# Patient Record
Sex: Male | Born: 1940 | Race: White | Hispanic: No | Marital: Married | Smoking: Never smoker
Health system: Southern US, Community
[De-identification: ages and names within clinical notes are randomized; demographics above are authoritative.]

## PROBLEM LIST (undated history)

## (undated) DIAGNOSIS — K5790 Diverticulosis of intestine, part unspecified, without perforation or abscess without bleeding: Secondary | ICD-10-CM

## (undated) DIAGNOSIS — C61 Malignant neoplasm of prostate: Secondary | ICD-10-CM

## (undated) DIAGNOSIS — D649 Anemia, unspecified: Secondary | ICD-10-CM

## (undated) DIAGNOSIS — E785 Hyperlipidemia, unspecified: Secondary | ICD-10-CM

## (undated) DIAGNOSIS — I10 Essential (primary) hypertension: Secondary | ICD-10-CM

## (undated) DIAGNOSIS — N138 Other obstructive and reflux uropathy: Secondary | ICD-10-CM

## (undated) DIAGNOSIS — N401 Enlarged prostate with lower urinary tract symptoms: Secondary | ICD-10-CM

## (undated) DIAGNOSIS — C801 Malignant (primary) neoplasm, unspecified: Secondary | ICD-10-CM

## (undated) HISTORY — DX: Malignant (primary) neoplasm, unspecified: C80.1

## (undated) HISTORY — DX: Anemia, unspecified: D64.9

## (undated) HISTORY — DX: Essential (primary) hypertension: I10

## (undated) HISTORY — DX: Hyperlipidemia, unspecified: E78.5

## (undated) HISTORY — PX: TONSILLECTOMY: SUR1361

---

## 2005-07-30 ENCOUNTER — Encounter: Admission: RE | Admit: 2005-07-30 | Discharge: 2005-07-30 | Payer: Self-pay | Admitting: Internal Medicine

## 2005-12-01 ENCOUNTER — Ambulatory Visit: Payer: Self-pay | Admitting: Family Medicine

## 2005-12-09 ENCOUNTER — Ambulatory Visit: Payer: Self-pay | Admitting: Family Medicine

## 2006-06-23 ENCOUNTER — Ambulatory Visit: Payer: Self-pay | Admitting: Family Medicine

## 2006-07-07 ENCOUNTER — Ambulatory Visit: Payer: Self-pay | Admitting: Family Medicine

## 2006-07-21 ENCOUNTER — Ambulatory Visit: Payer: Self-pay | Admitting: Family Medicine

## 2006-09-07 ENCOUNTER — Ambulatory Visit: Payer: Self-pay | Admitting: Family Medicine

## 2006-09-09 ENCOUNTER — Ambulatory Visit: Payer: Self-pay | Admitting: Gastroenterology

## 2006-09-23 ENCOUNTER — Ambulatory Visit: Payer: Self-pay | Admitting: Family Medicine

## 2006-10-05 ENCOUNTER — Ambulatory Visit: Payer: Self-pay | Admitting: Family Medicine

## 2007-01-21 ENCOUNTER — Ambulatory Visit: Payer: Self-pay | Admitting: Family Medicine

## 2007-09-23 ENCOUNTER — Ambulatory Visit: Payer: Self-pay | Admitting: Family Medicine

## 2008-08-10 ENCOUNTER — Ambulatory Visit: Payer: Self-pay | Admitting: Family Medicine

## 2008-10-09 ENCOUNTER — Ambulatory Visit: Payer: Self-pay | Admitting: Family Medicine

## 2008-11-27 ENCOUNTER — Ambulatory Visit: Payer: Self-pay | Admitting: Family Medicine

## 2009-06-18 ENCOUNTER — Ambulatory Visit: Payer: Self-pay | Admitting: Family Medicine

## 2009-07-17 ENCOUNTER — Ambulatory Visit: Payer: Self-pay | Admitting: Family Medicine

## 2009-10-16 ENCOUNTER — Ambulatory Visit: Payer: Self-pay | Admitting: Family Medicine

## 2010-02-18 ENCOUNTER — Ambulatory Visit: Payer: Self-pay | Admitting: Family Medicine

## 2010-06-24 ENCOUNTER — Ambulatory Visit: Payer: Self-pay | Admitting: Family Medicine

## 2010-07-17 ENCOUNTER — Ambulatory Visit (INDEPENDENT_AMBULATORY_CARE_PROVIDER_SITE_OTHER): Payer: PRIVATE HEALTH INSURANCE | Admitting: Family Medicine

## 2010-07-17 DIAGNOSIS — E7800 Pure hypercholesterolemia, unspecified: Secondary | ICD-10-CM

## 2010-07-17 DIAGNOSIS — I10 Essential (primary) hypertension: Secondary | ICD-10-CM

## 2010-07-17 DIAGNOSIS — E119 Type 2 diabetes mellitus without complications: Secondary | ICD-10-CM

## 2010-07-17 DIAGNOSIS — Z79899 Other long term (current) drug therapy: Secondary | ICD-10-CM

## 2010-10-15 ENCOUNTER — Other Ambulatory Visit: Payer: Self-pay | Admitting: Family Medicine

## 2010-11-03 ENCOUNTER — Encounter: Payer: Self-pay | Admitting: Family Medicine

## 2010-11-25 ENCOUNTER — Ambulatory Visit (INDEPENDENT_AMBULATORY_CARE_PROVIDER_SITE_OTHER): Payer: PRIVATE HEALTH INSURANCE | Admitting: Family Medicine

## 2010-11-25 ENCOUNTER — Encounter: Payer: Self-pay | Admitting: Family Medicine

## 2010-11-25 DIAGNOSIS — N529 Male erectile dysfunction, unspecified: Secondary | ICD-10-CM

## 2010-11-25 DIAGNOSIS — E1159 Type 2 diabetes mellitus with other circulatory complications: Secondary | ICD-10-CM

## 2010-11-25 DIAGNOSIS — I10 Essential (primary) hypertension: Secondary | ICD-10-CM

## 2010-11-25 DIAGNOSIS — E785 Hyperlipidemia, unspecified: Secondary | ICD-10-CM

## 2010-11-25 DIAGNOSIS — E669 Obesity, unspecified: Secondary | ICD-10-CM

## 2010-11-25 DIAGNOSIS — I152 Hypertension secondary to endocrine disorders: Secondary | ICD-10-CM | POA: Insufficient documentation

## 2010-11-25 DIAGNOSIS — E119 Type 2 diabetes mellitus without complications: Secondary | ICD-10-CM | POA: Insufficient documentation

## 2010-11-25 DIAGNOSIS — E1169 Type 2 diabetes mellitus with other specified complication: Secondary | ICD-10-CM

## 2010-11-25 LAB — POCT GLYCOSYLATED HEMOGLOBIN (HGB A1C): Hemoglobin A1C: 6.8

## 2010-11-25 NOTE — Progress Notes (Signed)
  Subjective:    Patient ID: Adrian Bass, male    DOB: 07-01-1940, 70 y.o.   MRN: [id number]  HPI He is here for a diabetes recheck. His weight and blood pressure are recorded. He is on medications listed in the chart. States his blood sugars run in the 140-150 range. Last eye exam was May . He exercises regularly. Does not smoke and drinks rarely he is still working well for him. He does check his feet regularly. In general he is doing quite well and is very happy. His immunizations are up-to-date. He continues to do quite well on his Levitra.   Review of Systems Negative except as above    Objective:   Physical Exam Alert and in no distress otherwise not examined. Hemoglobin A1c is 6.8       Assessment & Plan:   diabetes. Hypertension. Dyslipidemia. Obesity. Ed I will do a followup urine microalbumin and recheck here in 4 months.

## 2010-11-25 NOTE — Patient Instructions (Signed)
Keep  taking great care yourself. See back here in about 4 months

## 2010-12-25 ENCOUNTER — Other Ambulatory Visit: Payer: Self-pay

## 2010-12-25 NOTE — Telephone Encounter (Signed)
Dr.l wants me to call pt and find out how many pills he wants

## 2010-12-31 ENCOUNTER — Telehealth: Payer: Self-pay | Admitting: Family Medicine

## 2010-12-31 NOTE — Telephone Encounter (Signed)
DONE

## 2011-01-01 ENCOUNTER — Ambulatory Visit (INDEPENDENT_AMBULATORY_CARE_PROVIDER_SITE_OTHER): Payer: PRIVATE HEALTH INSURANCE | Admitting: Family Medicine

## 2011-01-01 DIAGNOSIS — N529 Male erectile dysfunction, unspecified: Secondary | ICD-10-CM

## 2011-01-01 DIAGNOSIS — H1045 Other chronic allergic conjunctivitis: Secondary | ICD-10-CM

## 2011-01-01 DIAGNOSIS — H1013 Acute atopic conjunctivitis, bilateral: Secondary | ICD-10-CM

## 2011-01-01 NOTE — Progress Notes (Signed)
  Subjective:    Patient ID: Adrian Bass, male    DOB: [birth year], 70 y.o.   MRN: [id number]  HPI He is here for consultation concerning possible medication change. He did have me write a prescription for Levitra. He plans to get this through a pharmacy in [place] for much better price. Also a letter was received from his insurance plan asking him to switch to a different eyedrop. I discussed this with him in detail explaining that this is a way for him to potentially save money but get a similar medication. At this time he is unwilling to change since the medicine seems to be working well.   Review of Systems     Objective:   Physical Exam Alert and in no distress otherwise not examined      Assessment & Plan:  ED. Allergic rhinitis. Continue on present medication regimen.

## 2011-01-01 NOTE — Patient Instructions (Signed)
Continue on your present medication regimen.

## 2011-01-11 ENCOUNTER — Other Ambulatory Visit: Payer: Self-pay | Admitting: Family Medicine

## 2011-01-12 NOTE — Telephone Encounter (Signed)
Is this ok?

## 2011-02-17 ENCOUNTER — Other Ambulatory Visit: Payer: Self-pay | Admitting: Family Medicine

## 2011-02-25 ENCOUNTER — Telehealth: Payer: Self-pay | Admitting: Family Medicine

## 2011-02-25 MED ORDER — VARDENAFIL HCL 20 MG PO TABS: 20.0000 mg | ORAL_TABLET | ORAL | Status: DC | PRN

## 2011-02-25 NOTE — Telephone Encounter (Signed)
Pt wants written rx for Levitra.  He is getting this out of [place] and it takes about 3 weeks from start to finish to receive this rx.

## 2011-02-25 NOTE — Telephone Encounter (Signed)
Levitra was renewed. Prescription written. He is using a Systems developer.

## 2011-03-31 ENCOUNTER — Ambulatory Visit: Payer: PRIVATE HEALTH INSURANCE | Admitting: Family Medicine

## 2011-04-02 ENCOUNTER — Ambulatory Visit (INDEPENDENT_AMBULATORY_CARE_PROVIDER_SITE_OTHER): Payer: No Typology Code available for payment source | Admitting: Family Medicine

## 2011-04-02 ENCOUNTER — Encounter: Payer: Self-pay | Admitting: Family Medicine

## 2011-04-02 DIAGNOSIS — E785 Hyperlipidemia, unspecified: Secondary | ICD-10-CM

## 2011-04-02 DIAGNOSIS — E1159 Type 2 diabetes mellitus with other circulatory complications: Secondary | ICD-10-CM

## 2011-04-02 DIAGNOSIS — I10 Essential (primary) hypertension: Secondary | ICD-10-CM

## 2011-04-02 DIAGNOSIS — M1990 Unspecified osteoarthritis, unspecified site: Secondary | ICD-10-CM

## 2011-04-02 DIAGNOSIS — E1169 Type 2 diabetes mellitus with other specified complication: Secondary | ICD-10-CM

## 2011-04-02 DIAGNOSIS — I714 Abdominal aortic aneurysm, without rupture, unspecified: Secondary | ICD-10-CM

## 2011-04-02 DIAGNOSIS — E669 Obesity, unspecified: Secondary | ICD-10-CM

## 2011-04-02 DIAGNOSIS — M129 Arthropathy, unspecified: Secondary | ICD-10-CM

## 2011-04-02 DIAGNOSIS — E119 Type 2 diabetes mellitus without complications: Secondary | ICD-10-CM

## 2011-04-02 NOTE — Patient Instructions (Signed)
I will you with results of the ultrasound.

## 2011-04-02 NOTE — Progress Notes (Signed)
  Subjective:    Patient ID: Adrian Bass, male    DOB: [birth year], 70 y.o.   MRN: [id number]  HPI He is here for a followup visit. He does not check his blood sugars regularly. He was seen last month by his ophthalmologist however the report is not present. He does check his feet periodically. Exercise is minimal. He does not smoke and drinks rarely. He was recently seen by his chiropractor for evaluation of back pain. The x-rays did show arthritic changes as well as aortic calcifications, approximately 3 cm in diameter.   Review of Systems     Objective:   Physical Exam Alert and in no distress. Hemoglobin A1c is 6.8       Assessment & Plan:   1. AAA (abdominal aortic aneurysm)  US Aorta Initial Medicare Screen  2. Diabetes mellitus  POCT HgB A1C  3. Hyperlipidemia LDL goal <70    4. Hypertension associated with diabetes    5. Obesity (BMI 30-39.9)    6. Arthritis     I discussed the x-ray results with him and appropriate followup based on the ultrasound. Encouraged him to check his blood sugars either before a meal or 2 hours after a meal.

## 2011-04-03 ENCOUNTER — Ambulatory Visit: Payer: No Typology Code available for payment source

## 2011-04-06 ENCOUNTER — Ambulatory Visit: Payer: No Typology Code available for payment source

## 2011-04-21 ENCOUNTER — Telehealth: Payer: Self-pay | Admitting: Family Medicine

## 2011-04-21 MED ORDER — VARDENAFIL HCL 20 MG PO TABS: 20.0000 mg | ORAL_TABLET | ORAL | Status: DC | PRN

## 2011-04-21 NOTE — Telephone Encounter (Signed)
MAILED

## 2011-04-21 NOTE — Telephone Encounter (Signed)
PT HAS NEW PHARM. FOR THIS MED. THIS NEEDS TO GO TO [facility] IN [place]. INFORMATION IS IN HIS PAPER CHART. ONCE AGAIN REFILL LEVITRA TO NEW PHARM.

## 2011-04-21 NOTE — Telephone Encounter (Signed)
New prescription for Levitra written and sent to the pharmacy in [place]

## 2011-04-22 ENCOUNTER — Ambulatory Visit: Payer: No Typology Code available for payment source

## 2011-04-28 ENCOUNTER — Ambulatory Visit
Admission: RE | Admit: 2011-04-28 | Discharge: 2011-04-28 | Disposition: A | Discharge status: Home / Self Care | Payer: No Typology Code available for payment source | Source: Ambulatory Visit | Attending: Family Medicine | Admitting: Family Medicine

## 2011-04-28 DIAGNOSIS — I714 Abdominal aortic aneurysm, without rupture: Secondary | ICD-10-CM

## 2011-05-26 ENCOUNTER — Other Ambulatory Visit: Payer: Self-pay | Admitting: Family Medicine

## 2011-06-03 ENCOUNTER — Other Ambulatory Visit: Payer: Self-pay

## 2011-06-03 ENCOUNTER — Telehealth: Payer: Self-pay | Admitting: Internal Medicine

## 2011-06-03 MED ORDER — VARDENAFIL HCL 20 MG PO TABS: 20.0000 mg | ORAL_TABLET | ORAL | Status: DC | PRN

## 2011-06-03 NOTE — Telephone Encounter (Signed)
Levitra prescription written

## 2011-07-01 ENCOUNTER — Telehealth: Payer: Self-pay | Admitting: Family Medicine

## 2011-07-01 NOTE — Telephone Encounter (Signed)
Left message for pt no samples and to call back if want some called in locally

## 2011-07-01 NOTE — Telephone Encounter (Signed)
Tell him that we have no samples of Levitra and call again if he would like it called in locally

## 2011-07-07 ENCOUNTER — Telehealth: Payer: Self-pay | Admitting: Family Medicine

## 2011-07-07 NOTE — Telephone Encounter (Signed)
Go ahead and call the patient

## 2011-07-07 NOTE — Telephone Encounter (Signed)
Called pt left message RX is ready

## 2011-07-08 ENCOUNTER — Other Ambulatory Visit: Payer: Self-pay

## 2011-07-08 MED ORDER — KETOTIFEN FUMARATE 0.025 % OP SOLN: 1.0000 [drp] | Freq: Two times a day (BID) | OPHTHALMIC | Status: AC

## 2011-07-08 NOTE — Telephone Encounter (Signed)
Sent med in per jcl 

## 2011-07-14 ENCOUNTER — Encounter: Payer: Self-pay | Admitting: Internal Medicine

## 2011-07-20 ENCOUNTER — Encounter: Payer: Self-pay | Admitting: Family Medicine

## 2011-07-20 ENCOUNTER — Ambulatory Visit (INDEPENDENT_AMBULATORY_CARE_PROVIDER_SITE_OTHER): Payer: Medicare Other | Admitting: Family Medicine

## 2011-07-20 DIAGNOSIS — N529 Male erectile dysfunction, unspecified: Secondary | ICD-10-CM

## 2011-07-20 DIAGNOSIS — E119 Type 2 diabetes mellitus without complications: Secondary | ICD-10-CM

## 2011-07-20 DIAGNOSIS — E1169 Type 2 diabetes mellitus with other specified complication: Secondary | ICD-10-CM | POA: Diagnosis not present

## 2011-07-20 DIAGNOSIS — Z79899 Other long term (current) drug therapy: Secondary | ICD-10-CM

## 2011-07-20 DIAGNOSIS — E669 Obesity, unspecified: Secondary | ICD-10-CM

## 2011-07-20 DIAGNOSIS — I10 Essential (primary) hypertension: Secondary | ICD-10-CM

## 2011-07-20 DIAGNOSIS — E785 Hyperlipidemia, unspecified: Secondary | ICD-10-CM

## 2011-07-20 DIAGNOSIS — E1159 Type 2 diabetes mellitus with other circulatory complications: Secondary | ICD-10-CM

## 2011-07-20 LAB — LIPID PANEL
HDL: 43 mg/dL (ref >39)
LDL Cholesterol: 65 mg/dL (ref 0–99)
Total CHOL/HDL Ratio: 3.5 Ratio
Triglycerides: 220 mg/dL — ABNORMAL HIGH (ref <150)

## 2011-07-20 LAB — CBC WITH DIFFERENTIAL/PLATELET
Basophils Absolute: 0 10*3/uL (ref 0.0–0.1)
Eosinophils Relative: 2 % (ref 0–5)
HCT: 39.9 % (ref 39.0–52.0)
Lymphocytes Relative: 20 % (ref 12–46)
MCHC: 32.8 g/dL (ref 30.0–36.0)
MCV: 83.1 fL (ref 78.0–100.0)
Monocytes Absolute: 0.5 10*3/uL (ref 0.1–1.0)
Monocytes Relative: 9 % (ref 3–12)
Neutro Abs: 4.2 10*3/uL (ref 1.7–7.7)
Neutrophils Relative %: 70 % (ref 43–77)

## 2011-07-20 LAB — COMPREHENSIVE METABOLIC PANEL
ALT: 18 U/L (ref 0–53)
AST: 24 U/L (ref 0–37)
CO2: 25 mEq/L (ref 19–32)
Chloride: 101 mEq/L (ref 96–112)
Glucose, Bld: 209 mg/dL — ABNORMAL HIGH (ref 70–99)
Total Bilirubin: 0.9 mg/dL (ref 0.3–1.2)

## 2011-07-20 NOTE — Progress Notes (Signed)
  Subjective:    Patient ID: Adrian Bass, male    DOB: [birth year], 71 y.o.   MRN: [id number]  HPI He is here  for a followup. He states his blood sugars run in the 150 range. He does check them before and sometimes 2 or 3 hours after eating. He does intermittently check his feet. He exercises 4 times per week. He has an eye exam set up or the next several weeks. He continues on medications listed in the chart. His ED is responding nicely to Levitra. He has no particular concerns or complaints.   Review of Systems     Objective:   Physical Exam Alert and in no distress. Hemoglobin A1c is 6.9.       Assessment & Plan:   1. Diabetes mellitus  POCT HgB A1C, CBC with Differential, Comprehensive metabolic panel, Lipid panel, POCT UA - Microalbumin  2. Hypertension associated with diabetes  CBC with Differential, Comprehensive metabolic panel  3. Hyperlipidemia LDL goal <70  Lipid panel  4. ED (erectile dysfunction)    5. Obesity (BMI 30-39.9)  CBC with Differential, Comprehensive metabolic panel  6. Encounter for long-term (current) use of other medications  CBC with Differential, Comprehensive metabolic panel, Lipid panel

## 2011-07-20 NOTE — Patient Instructions (Signed)
Continue to take good care of yourself 

## 2011-07-21 NOTE — Progress Notes (Signed)
Quick Note:  The blood work is normal ______ 

## 2011-07-22 ENCOUNTER — Telehealth: Payer: Self-pay | Admitting: Internal Medicine

## 2011-07-22 MED ORDER — OLMESARTAN MEDOXOMIL-HCTZ 40-25 MG PO TABS: 1.0000 | ORAL_TABLET | Freq: Every day | ORAL | Status: DC

## 2011-07-22 MED ORDER — ATORVASTATIN CALCIUM 40 MG PO TABS: 40.0000 mg | ORAL_TABLET | Freq: Every day | ORAL | Status: DC

## 2011-07-22 MED ORDER — METOPROLOL SUCCINATE ER 25 MG PO TB24: 25.0000 mg | ORAL_TABLET | Freq: Every day | ORAL | Status: DC

## 2011-07-22 NOTE — Telephone Encounter (Signed)
Got a request for 30 day supply for 3 meds [facility] [place]. Sent in a 30 day supply

## 2011-07-30 ENCOUNTER — Ambulatory Visit: Payer: No Typology Code available for payment source

## 2011-08-04 DIAGNOSIS — H3530 Unspecified macular degeneration: Secondary | ICD-10-CM | POA: Diagnosis not present

## 2011-08-04 DIAGNOSIS — H25019 Cortical age-related cataract, unspecified eye: Secondary | ICD-10-CM | POA: Diagnosis not present

## 2011-08-04 DIAGNOSIS — H40029 Open angle with borderline findings, high risk, unspecified eye: Secondary | ICD-10-CM | POA: Diagnosis not present

## 2011-08-04 DIAGNOSIS — H2510 Age-related nuclear cataract, unspecified eye: Secondary | ICD-10-CM | POA: Diagnosis not present

## 2011-08-10 ENCOUNTER — Telehealth: Payer: Self-pay | Admitting: Family Medicine

## 2011-08-11 NOTE — Telephone Encounter (Signed)
CALLED PT TO PICK UP KDS

## 2011-08-27 ENCOUNTER — Other Ambulatory Visit: Payer: Self-pay | Admitting: Family Medicine

## 2011-08-27 MED ORDER — OLMESARTAN MEDOXOMIL-HCTZ 40-25 MG PO TABS: 1.0000 | ORAL_TABLET | Freq: Every day | ORAL | Status: DC

## 2011-08-31 ENCOUNTER — Telehealth: Payer: Self-pay | Admitting: Family Medicine

## 2011-08-31 NOTE — Telephone Encounter (Signed)
Pt called wanting to know why we refilled the Benicar.  I reviewed the chart notes and it appears his pharmacy sent in a request and Dr. Susann Givens refilled. Pt hung up.

## 2011-09-01 ENCOUNTER — Telehealth: Payer: Self-pay | Admitting: Family Medicine

## 2011-09-01 NOTE — Telephone Encounter (Signed)
Pt was called.

## 2011-09-18 ENCOUNTER — Other Ambulatory Visit: Payer: Self-pay | Admitting: Family Medicine

## 2011-09-21 ENCOUNTER — Telehealth: Payer: Self-pay | Admitting: Internal Medicine

## 2011-09-21 MED ORDER — METFORMIN HCL 850 MG PO TABS: ORAL_TABLET | ORAL | Status: DC

## 2011-09-21 MED ORDER — ATORVASTATIN CALCIUM 40 MG PO TABS: 40.0000 mg | ORAL_TABLET | Freq: Every day | ORAL | Status: DC

## 2011-09-21 MED ORDER — METOPROLOL SUCCINATE ER 25 MG PO TB24: 25.0000 mg | ORAL_TABLET | Freq: Every day | ORAL | Status: DC

## 2011-09-21 NOTE — Telephone Encounter (Signed)
I filled 3 of pt meds to get him to his July appt. Metoprolol 25mg  24hr, lipitor 40mg ,metformin 850mg . All a 30 day supply with 2 refills

## 2011-11-10 ENCOUNTER — Other Ambulatory Visit: Payer: Self-pay | Admitting: Family Medicine

## 2011-11-10 ENCOUNTER — Telehealth: Payer: Self-pay | Admitting: Family Medicine

## 2011-11-10 MED ORDER — VARDENAFIL HCL 20 MG PO TABS: 20.0000 mg | ORAL_TABLET | ORAL | Status: DC | PRN

## 2011-11-10 MED ORDER — ATORVASTATIN CALCIUM 40 MG PO TABS: 40.0000 mg | ORAL_TABLET | Freq: Every day | ORAL | Status: DC

## 2011-11-10 NOTE — Telephone Encounter (Signed)
Received request in mail from Adrian Bass stating needs another written rx for Levitra.  He was given a rx in Jun 03, 2011 for 40 tabs with 4 refills and sent to [facility].  Again he rcd rx for Levitra 20 mg  with 40 tab with 5 refills on 08/10/11 and now req another rx  I have left message for patient to call as Dr. Susann Givens is questioning another refill request.  I also called [facility] at [phone] they will not answer any questions over the phone. I can email questions to [email or web address] or fax them questions.  Adrian Bass called back and I advised him of the refills, he was not aware, he states [facility] probably threw the rx away.  I advised him they are not allowed to do that.  He will call and see if they show the refills and will call us back.

## 2011-11-13 ENCOUNTER — Other Ambulatory Visit: Payer: Self-pay | Admitting: Family Medicine

## 2011-11-13 NOTE — Telephone Encounter (Signed)
rx refill

## 2011-11-24 ENCOUNTER — Ambulatory Visit: Payer: Medicare Other | Admitting: Family Medicine

## 2011-11-30 ENCOUNTER — Ambulatory Visit (INDEPENDENT_AMBULATORY_CARE_PROVIDER_SITE_OTHER): Payer: Medicare Other | Admitting: Family Medicine

## 2011-11-30 ENCOUNTER — Encounter: Payer: Self-pay | Admitting: Family Medicine

## 2011-11-30 VITALS — BP 126/72 | HR 93 | Wt 218.0 lb

## 2011-11-30 DIAGNOSIS — E669 Obesity, unspecified: Secondary | ICD-10-CM

## 2011-11-30 DIAGNOSIS — E785 Hyperlipidemia, unspecified: Secondary | ICD-10-CM

## 2011-11-30 DIAGNOSIS — I10 Essential (primary) hypertension: Secondary | ICD-10-CM

## 2011-11-30 DIAGNOSIS — E1159 Type 2 diabetes mellitus with other circulatory complications: Secondary | ICD-10-CM

## 2011-11-30 DIAGNOSIS — E1169 Type 2 diabetes mellitus with other specified complication: Secondary | ICD-10-CM

## 2011-11-30 DIAGNOSIS — N529 Male erectile dysfunction, unspecified: Secondary | ICD-10-CM

## 2011-11-30 DIAGNOSIS — E119 Type 2 diabetes mellitus without complications: Secondary | ICD-10-CM

## 2011-11-30 LAB — POCT GLYCOSYLATED HEMOGLOBIN (HGB A1C): Hemoglobin A1C: 6.8

## 2011-11-30 NOTE — Progress Notes (Signed)
  Subjective:    Patient ID: Adrian Bass, male    DOB: 07/21/1940, 71 y.o.   MRN: [id number]  HPI He is here for a diabetes recheck. He continues on medications listed in the chart. He does check his feet regularly. He does check blood sugars periodically. Smoking and drinking were reviewed. He is sexually active and Levitra is working quite well for this. He has had an eye exam this year. He does keep himself relatively active.   Review of Systems     Objective:   Physical Exam  Alert and in no distress. Globin A1c is 6.8.      Assessment & Plan:   1. Diabetes mellitus  POCT glycosylated hemoglobin (Hb A1C)  2. Hypertension associated with diabetes    3. Hyperlipidemia LDL goal <70    4. ED (erectile dysfunction)    5. Obesity (BMI 30-39.9)

## 2011-12-28 ENCOUNTER — Encounter: Payer: Self-pay | Admitting: Family Medicine

## 2011-12-28 ENCOUNTER — Ambulatory Visit (INDEPENDENT_AMBULATORY_CARE_PROVIDER_SITE_OTHER): Payer: Medicare Other | Admitting: Family Medicine

## 2011-12-28 VITALS — BP 130/80 | HR 90 | Wt 218.0 lb

## 2011-12-28 DIAGNOSIS — R350 Frequency of micturition: Secondary | ICD-10-CM

## 2011-12-28 DIAGNOSIS — N453 Epididymo-orchitis: Secondary | ICD-10-CM

## 2011-12-28 DIAGNOSIS — N451 Epididymitis: Secondary | ICD-10-CM

## 2011-12-28 DIAGNOSIS — N400 Enlarged prostate without lower urinary tract symptoms: Secondary | ICD-10-CM | POA: Insufficient documentation

## 2011-12-28 LAB — POCT URINALYSIS DIPSTICK
Bilirubin, UA: NEGATIVE
Spec Grav, UA: 1.02
pH, UA: 5

## 2011-12-28 MED ORDER — CIPROFLOXACIN HCL 500 MG PO TABS: 500.0000 mg | ORAL_TABLET | Freq: Two times a day (BID) | ORAL | Status: AC

## 2011-12-28 NOTE — Progress Notes (Signed)
  Subjective:    Patient ID: Adrian Bass, male    DOB: [birth year], 71 y.o.   MRN: [id number]  HPI She has a several month history of intermittent burning sensation but no discharge, back lower abdominal or pelvic discomfort. No fever or chills. The same sexual partner for the last 23 years.   Review of Systems     Objective:   Physical Exam Abdominal exam shows no masses. The right testes normal. Left chest he shows a slightly swollen epididymis as well as spermatic cord. Rectal exam shows a diffusely enlarged prostate but nontender and firm. Urinalysis is negative       Assessment & Plan:   1. Urine frequency  POCT Urinalysis Dipstick  2. Epididymitis  ciprofloxacin (CIPRO) 500 MG tablet  3. BPH (benign prostatic hyperplasia)     He is to return here in 2 weeks for recheck.

## 2012-01-15 ENCOUNTER — Other Ambulatory Visit: Payer: Self-pay | Admitting: Family Medicine

## 2012-01-20 ENCOUNTER — Ambulatory Visit: Payer: Medicare Other | Admitting: Family Medicine

## 2012-01-22 ENCOUNTER — Encounter: Payer: Self-pay | Admitting: Family Medicine

## 2012-01-22 ENCOUNTER — Ambulatory Visit (INDEPENDENT_AMBULATORY_CARE_PROVIDER_SITE_OTHER): Payer: Medicare Other | Admitting: Family Medicine

## 2012-01-22 VITALS — BP 120/84 | HR 78 | Wt 218.0 lb

## 2012-01-22 DIAGNOSIS — N451 Epididymitis: Secondary | ICD-10-CM

## 2012-01-22 DIAGNOSIS — N453 Epididymo-orchitis: Secondary | ICD-10-CM

## 2012-01-22 MED ORDER — CIPROFLOXACIN HCL 500 MG PO TABS: 2.0000 mg | ORAL_TABLET | Freq: Two times a day (BID) | ORAL | Status: AC

## 2012-01-22 NOTE — Patient Instructions (Signed)
Take a full tablet twice per day contrary to what the prescription says to

## 2012-01-22 NOTE — Progress Notes (Signed)
  Subjective:    Patient ID: Adrian Bass, male    DOB: Jul 04, 1940, 71 y.o.   MRN: [id number]  HPI He is here for recheck on his epididymitis. He states that after several days he got roughly 90% better however since stopping the antibiotic he has noted a return of the discomfort and dysuria.   Review of Systems     Objective:   Physical Exam Alert and in no distress. Left epididymis is still swollen. Testes normal.      Assessment & Plan:  Unresolved epididymitis. I will treat for 2 weeks and then recheck.

## 2012-02-01 DIAGNOSIS — H40029 Open angle with borderline findings, high risk, unspecified eye: Secondary | ICD-10-CM | POA: Diagnosis not present

## 2012-02-01 DIAGNOSIS — H43819 Vitreous degeneration, unspecified eye: Secondary | ICD-10-CM | POA: Diagnosis not present

## 2012-02-01 DIAGNOSIS — H31009 Unspecified chorioretinal scars, unspecified eye: Secondary | ICD-10-CM | POA: Diagnosis not present

## 2012-02-17 ENCOUNTER — Other Ambulatory Visit: Payer: Self-pay | Admitting: Family Medicine

## 2012-02-19 ENCOUNTER — Ambulatory Visit: Payer: Medicare Other | Admitting: Family Medicine

## 2012-02-23 ENCOUNTER — Encounter: Payer: Self-pay | Admitting: Family Medicine

## 2012-02-23 ENCOUNTER — Ambulatory Visit (INDEPENDENT_AMBULATORY_CARE_PROVIDER_SITE_OTHER): Payer: Medicare Other | Admitting: Family Medicine

## 2012-02-23 VITALS — BP 128/80 | HR 78 | Wt 220.0 lb

## 2012-02-23 DIAGNOSIS — N453 Epididymo-orchitis: Secondary | ICD-10-CM

## 2012-02-23 DIAGNOSIS — N451 Epididymitis: Secondary | ICD-10-CM

## 2012-02-23 NOTE — Progress Notes (Signed)
  Subjective:    Patient ID: Adrian Bass, male    DOB: [birth year], 71 y.o.   MRN: [id number]  HPI He is here for a followup. He still is having some urinary tract symptoms of some discomfort with urination. He has finished his course of antibiotic.   Review of Systems     Objective:   Physical Exam Alert and in no distress. The left testis he appears normal however the spermatic cord is swollen and firm.      Assessment & Plan:   1. Epididymitis  Ambulatory referral to Urology

## 2012-03-02 ENCOUNTER — Telehealth: Payer: Self-pay | Admitting: Family Medicine

## 2012-03-02 NOTE — Telephone Encounter (Signed)
PHARM CHANGED

## 2012-03-06 ENCOUNTER — Other Ambulatory Visit: Payer: Self-pay | Admitting: Family Medicine

## 2012-03-18 ENCOUNTER — Other Ambulatory Visit: Payer: Self-pay | Admitting: Medical

## 2012-03-18 NOTE — Telephone Encounter (Signed)
RX REFILL ON ZOLOFT

## 2012-03-18 NOTE — Telephone Encounter (Signed)
Dr.Lalonde is this ok 

## 2012-03-18 NOTE — Telephone Encounter (Signed)
I don't believe I've seen this patient before.  Refill request.

## 2012-03-25 ENCOUNTER — Other Ambulatory Visit: Payer: Self-pay | Admitting: *Deleted

## 2012-03-25 DIAGNOSIS — E785 Hyperlipidemia, unspecified: Secondary | ICD-10-CM

## 2012-03-25 MED ORDER — ATORVASTATIN CALCIUM 40 MG PO TABS: 40.0000 mg | ORAL_TABLET | Freq: Every day | ORAL | Status: DC

## 2012-03-28 ENCOUNTER — Telehealth: Payer: Self-pay | Admitting: Family Medicine

## 2012-03-28 NOTE — Telephone Encounter (Signed)
kds °

## 2012-03-31 ENCOUNTER — Ambulatory Visit: Payer: Medicare Other | Admitting: Family Medicine

## 2012-04-06 DIAGNOSIS — N401 Enlarged prostate with lower urinary tract symptoms: Secondary | ICD-10-CM | POA: Diagnosis not present

## 2012-04-06 DIAGNOSIS — N402 Nodular prostate without lower urinary tract symptoms: Secondary | ICD-10-CM | POA: Diagnosis not present

## 2012-04-07 ENCOUNTER — Other Ambulatory Visit: Payer: Self-pay

## 2012-04-07 NOTE — Telephone Encounter (Signed)
Pt was called and informed we can not make his meds refill at one time this is something he will have to do per [place]

## 2012-04-21 DIAGNOSIS — N402 Nodular prostate without lower urinary tract symptoms: Secondary | ICD-10-CM | POA: Diagnosis not present

## 2012-04-25 DIAGNOSIS — C61 Malignant neoplasm of prostate: Secondary | ICD-10-CM

## 2012-04-25 DIAGNOSIS — IMO0002 Reserved for concepts with insufficient information to code with codable children: Secondary | ICD-10-CM | POA: Diagnosis not present

## 2012-04-25 DIAGNOSIS — R972 Elevated prostate specific antigen [PSA]: Secondary | ICD-10-CM | POA: Diagnosis not present

## 2012-04-25 DIAGNOSIS — N402 Nodular prostate without lower urinary tract symptoms: Secondary | ICD-10-CM | POA: Diagnosis not present

## 2012-04-25 HISTORY — DX: Malignant neoplasm of prostate: C61

## 2012-04-25 HISTORY — PX: PROSTATE BIOPSY: SHX241

## 2012-05-03 ENCOUNTER — Other Ambulatory Visit (HOSPITAL_COMMUNITY): Payer: Self-pay | Admitting: Urology

## 2012-05-03 DIAGNOSIS — C61 Malignant neoplasm of prostate: Secondary | ICD-10-CM

## 2012-05-10 DIAGNOSIS — C61 Malignant neoplasm of prostate: Secondary | ICD-10-CM | POA: Diagnosis not present

## 2012-05-10 DIAGNOSIS — N401 Enlarged prostate with lower urinary tract symptoms: Secondary | ICD-10-CM | POA: Diagnosis not present

## 2012-05-12 ENCOUNTER — Encounter: Payer: Self-pay | Admitting: Internal Medicine

## 2012-05-17 ENCOUNTER — Encounter (HOSPITAL_COMMUNITY)
Admission: RE | Admit: 2012-05-17 | Discharge: 2012-05-17 | Disposition: A | Discharge status: Home / Self Care | Payer: Medicare Other | Source: Ambulatory Visit | Attending: Urology | Admitting: Urology

## 2012-05-17 DIAGNOSIS — C61 Malignant neoplasm of prostate: Secondary | ICD-10-CM | POA: Insufficient documentation

## 2012-05-17 MED ORDER — TECHNETIUM TC 99M MEDRONATE IV KIT
25.0000 | PACK | Freq: Once | INTRAVENOUS | Status: AC | PRN
  Administered 2012-05-17: 25 via INTRAVENOUS

## 2012-05-25 ENCOUNTER — Telehealth: Payer: Self-pay | Admitting: Family Medicine

## 2012-05-25 DIAGNOSIS — E785 Hyperlipidemia, unspecified: Secondary | ICD-10-CM

## 2012-05-25 MED ORDER — METOPROLOL SUCCINATE ER 25 MG PO TB24: 25.0000 mg | ORAL_TABLET | Freq: Every day | ORAL | Status: DC

## 2012-05-25 MED ORDER — SERTRALINE HCL 50 MG PO TABS: 50.0000 mg | ORAL_TABLET | Freq: Every day | ORAL | Status: DC

## 2012-05-25 MED ORDER — ATORVASTATIN CALCIUM 40 MG PO TABS: 40.0000 mg | ORAL_TABLET | Freq: Every day | ORAL | Status: DC

## 2012-05-25 MED ORDER — METFORMIN HCL 850 MG PO TABS: 850.0000 mg | ORAL_TABLET | Freq: Two times a day (BID) | ORAL | Status: DC

## 2012-05-25 MED ORDER — OLMESARTAN MEDOXOMIL-HCTZ 40-25 MG PO TABS: 1.0000 | ORAL_TABLET | Freq: Every day | ORAL | Status: DC

## 2012-05-25 NOTE — Telephone Encounter (Signed)
Called and left message pt needs diabetes check or med check but all med but levetra were sent to [facility]

## 2012-05-25 NOTE — Telephone Encounter (Signed)
Pt called and stated he was changing pharmacies. He needs all meds (with the exception of levitra) refilled to new pharmacy. Please refill lipitor, metformin, toprol xl, benicar and [facility]ft to [facility] [address]. Once again this should be all medications with the exception of levitra.

## 2012-05-30 ENCOUNTER — Ambulatory Visit
Admission: RE | Admit: 2012-05-30 | Discharge: 2012-05-30 | Disposition: A | Discharge status: Home / Self Care | Payer: Medicare Other | Source: Ambulatory Visit | Attending: Radiation Oncology | Admitting: Radiation Oncology

## 2012-05-30 ENCOUNTER — Encounter: Payer: Self-pay | Admitting: Radiation Oncology

## 2012-05-30 VITALS — BP 150/82 | HR 82 | Temp 98.2°F | Resp 20 | Ht 72.0 in | Wt 224.0 lb

## 2012-05-30 DIAGNOSIS — I10 Essential (primary) hypertension: Secondary | ICD-10-CM | POA: Insufficient documentation

## 2012-05-30 DIAGNOSIS — E119 Type 2 diabetes mellitus without complications: Secondary | ICD-10-CM | POA: Insufficient documentation

## 2012-05-30 DIAGNOSIS — N401 Enlarged prostate with lower urinary tract symptoms: Secondary | ICD-10-CM | POA: Insufficient documentation

## 2012-05-30 DIAGNOSIS — E785 Hyperlipidemia, unspecified: Secondary | ICD-10-CM | POA: Diagnosis not present

## 2012-05-30 DIAGNOSIS — Z79899 Other long term (current) drug therapy: Secondary | ICD-10-CM | POA: Diagnosis not present

## 2012-05-30 DIAGNOSIS — C61 Malignant neoplasm of prostate: Secondary | ICD-10-CM | POA: Diagnosis not present

## 2012-05-30 DIAGNOSIS — N400 Enlarged prostate without lower urinary tract symptoms: Secondary | ICD-10-CM

## 2012-05-30 DIAGNOSIS — N138 Other obstructive and reflux uropathy: Secondary | ICD-10-CM | POA: Insufficient documentation

## 2012-05-30 HISTORY — DX: Diverticulosis of intestine, part unspecified, without perforation or abscess without bleeding: K57.90

## 2012-05-30 HISTORY — DX: Other obstructive and reflux uropathy: N40.1

## 2012-05-30 HISTORY — DX: Malignant neoplasm of prostate: C61

## 2012-05-30 HISTORY — DX: Other obstructive and reflux uropathy: N13.8

## 2012-05-30 NOTE — Progress Notes (Signed)
Please see the Nurse Progress Note in the MD Initial Consult Encounter for this patient. 

## 2012-05-30 NOTE — Progress Notes (Signed)
Radiation Oncology         [phone] ________________________________  Initial outpatient Consultation  Name: Adrian Bass MRN: [id number]  Date: 05/30/2012  DOB: 26-Jul-1940  WU:[id number],NWGN Leonette Most, MD  Crecencio Mc, MD   REFERRING PHYSICIAN: Crecencio Mc, MD  DIAGNOSIS: 72 y.o. 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 gentleman.  He was noted to have worsening voiding symptoms by his primary care physician, Dr. Susann Givens.  Accordingly, he was referred for evaluation in urology by Dr. Laverle Patter on 04/06/12,  digital rectal examination was performed at that time revealing a discrete nodule and induration of the entire left prostate with a gland size of 50 g.  Subsequent PSA was elevated at 70.7.  The patient proceeded to transrectal ultrasound with 12 biopsies of the prostate on 04/25/12.  The prostate volume measured 104.6 cc.  Out of 12 core biopsies, 11 were positive.  The maximum Gleason score was 4+5.  The patient reviewed the biopsy results with his urologist and he has kindly been referred today for discussion of potential radiation treatment options.  PREVIOUS RADIATION THERAPY: No  PAST MEDICAL HISTORY:  has a past medical history of Hypertension; Hyperlipidemia; Adenocarcinoma; Prostate cancer (04/25/12); Diabetes mellitus; BPH with obstruction/lower urinary tract symptoms; and Diverticulosis.    PAST SURGICAL HISTORY: Past Surgical History  Procedure Date  . Prostate biopsy 04/25/12    Adenocarcinoma    FAMILY HISTORY: family history includes Heart disease in his father and mother.  SOCIAL HISTORY:  reports that he has never smoked. He has never used smokeless tobacco. He reports that he drinks about .6 ounces of alcohol per week. He reports that he does not use illicit drugs.  ALLERGIES: Review of patient's allergies indicates no known  allergies.  MEDICATIONS:  Current Outpatient Prescriptions  Medication Sig Dispense Refill  . atorvastatin (LIPITOR) 40 MG tablet Take 1 tablet (40 mg total) by mouth daily.  30 tablet  0  . metFORMIN (GLUCOPHAGE) 850 MG tablet Take 1 tablet (850 mg total) by mouth 2 (two) times daily with a meal.  60 tablet  0  . metoprolol succinate (TOPROL-XL) 25 MG 24 hr tablet Take 1 tablet (25 mg total) by mouth daily.  30 tablet  5  . olmesartan-hydrochlorothiazide (BENICAR HCT) 40-25 MG per tablet Take 1 tablet by mouth daily.  30 tablet  11  . sertraline (ZOLOFT) 50 MG tablet Take 1 tablet (50 mg total) by mouth daily.  30 tablet  0  . vardenafil (LEVITRA) 20 MG tablet Take 1 tablet (20 mg total) by mouth as needed for erectile dysfunction.  40 tablet  5    REVIEW OF SYSTEMS:  A 15 point review of systems is documented in the electronic medical record. This was obtained by the nursing staff. However, I reviewed this with the patient to discuss relevant findings and make appropriate changes.  A comprehensive review of systems was negative..  The patient completed an IPSS and IIEF questionnaire.  His IPSS score was 23 indicating severe urinary outflow obstructive symptoms.  He indicated that his erectile function is able to complete sexual activity about half the time.   PHYSICAL EXAM: This patient is in no acute distress.  He is alert and oriented.   vitals were not taken for this visit. He exhibits no respiratory distress or labored breathing.  He appears neurologically intact.  His  mood is pleasant.  His affect is appropriate.  Please note the digital rectal exam findings described above.  LABORATORY DATA:  Lab Results  Component Value Date   WBC 6.0 07/20/2011   HGB 13.1 07/20/2011   HCT 39.9 07/20/2011   MCV 83.1 07/20/2011   PLT 230 07/20/2011   Lab Results  Component Value Date   NA 138 07/20/2011   K 4.0 07/20/2011   CL 101 07/20/2011   CO2 25 07/20/2011   Lab Results  Component Value Date   ALT 18  07/20/2011   AST 24 07/20/2011   ALKPHOS 72 07/20/2011   BILITOT 0.9 07/20/2011     RADIOGRAPHY:  Pelvic CT on 05/17/2012 showed enlarged prostate with no over mets  Nm Bone Scan Whole Body  05/17/2012  *RADIOLOGY REPORT*  Clinical Data: Prostate cancer.  PSA is 70.7  NUCLEAR MEDICINE WHOLE BODY BONE SCINTIGRAPHY  Technique:  Whole body anterior and posterior images were obtained approximately 3 hours after intravenous injection of radiopharmaceutical.  Radiopharmaceutical: CURIE TC-MDP TECHNETIUM TC 32M MEDRONATE IV KIT  Comparison: CT pelvis 05/17/2012  Findings: There are symmetric areas of uptake in the shoulders most consistent with degenerative change.  There is focal uptake in the right knee consistent with degenerative change.  There are no suspicious areas of uptake in the bony skeleton to suggest bony metastatic disease.  Specifically, no abnormal uptake is seen in the left femoral neck, where a probably benign sclerotic lesion was noted on recent pelvis CT of 05/17/2012.  IMPRESSION:  1.  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 2.  No evidence of bony metastatic disease.   Original Report Authenticated By: Britta Mccreedy, M.D.       IMPRESSION: This gentleman is a 72 y.o. 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  His T-Stage, Gleason's Score, and PSA put him into the high risk group.  Accordingly he is eligible for a variety of potential treatment options including IMRT with hormone ablation or prostatectomy.  PLAN:Today I reviewed the findings and workup thus far.  We discussed the natural history of prostate cancer.  We reviewed the the implications of T-stage, Gleason's Score, and PSA on decision-making and outcomes in prostate cancer.  We discussed radiation treatment in the management of prostate cancer with regard to the logistics and delivery of external beam radiation  treatment as well as the logistics and delivery of prostate brachytherapy.  We compared and contrasted each of these approaches and also compared these against prostatectomy.  The patient expressed interest in external beam radiotherapy.  I filled out a patient counseling form for him with relevant treatment diagrams and we retained a copy for our records.   The patient would like to proceed with prostate IMRT.  I will share my findings with Dr. Laverle Patter and move forward with hromone therapy now, and scheduling placement of three gold fiducial markers into the prostate in 5-6 weeks to proceed with IMRT two months after hormone initiation.     I enjoyed meeting with him today, and will look forward to participating in the care of this very nice gentleman.  I spent 60 minutes face to face with the patient and more than 50% of that time was spent in counseling and/or coordination of care.   ------------------------------------------------  Artist Pais. Kathrynn Running, M.D.

## 2012-05-30 NOTE — Progress Notes (Signed)
New Consult Prostate Cancer Biopsy 04/25/12=Adenocarcinoma,Gleason= 4+3=7, & 4+5=9,PSA=70.71,Volume=104.6cc  Married, no children, alert,oriented x3 , no c/o pain,  Nocturia 2-4x, dysuria, not completely emptying bladder at times, slow strem, No family hx prostate cancer   Allergies:NKDA  No hx Radiation No hx Pacemaker

## 2012-05-31 ENCOUNTER — Encounter: Payer: Medicare Other | Admitting: Family Medicine

## 2012-05-31 ENCOUNTER — Ambulatory Visit (INDEPENDENT_AMBULATORY_CARE_PROVIDER_SITE_OTHER): Payer: Medicare Other | Admitting: Family Medicine

## 2012-05-31 ENCOUNTER — Encounter: Payer: Self-pay | Admitting: Family Medicine

## 2012-05-31 VITALS — BP 118/70 | HR 78 | Wt 221.0 lb

## 2012-05-31 DIAGNOSIS — C61 Malignant neoplasm of prostate: Secondary | ICD-10-CM

## 2012-05-31 DIAGNOSIS — E1169 Type 2 diabetes mellitus with other specified complication: Secondary | ICD-10-CM | POA: Diagnosis not present

## 2012-05-31 DIAGNOSIS — E785 Hyperlipidemia, unspecified: Secondary | ICD-10-CM

## 2012-05-31 DIAGNOSIS — I10 Essential (primary) hypertension: Secondary | ICD-10-CM

## 2012-05-31 DIAGNOSIS — E119 Type 2 diabetes mellitus without complications: Secondary | ICD-10-CM

## 2012-05-31 NOTE — Progress Notes (Signed)
  Subjective:    Patient ID: Adrian Bass, male    DOB: 1940-11-22, 72 y.o.   MRN: [id number]  HPI He is here for a recheck. Since his last visit he has been seen by urology. PSA testing was quite elevated and he was subsequently diagnosed with prostate cancer. His Gleason score was 9. He has been evaluated by her is specialist and apparently has decided on hormone therapy initially and possibly radiation after that. He did have questions concerning Y. PSA testing was not done in the past. Explained to him that it is not universally accepted that PSA testing even be done. He continues on medications for his diabetes, hypertension, hyperlipidemia as well as ED. He has no questions concerning these.  Review of Systems     Objective:   Physical Exam Alert and in no distress. Hemoglobin A1c is 7.2.       Assessment & Plan:   1. Diabetes mellitus  POCT glycosylated hemoglobin (Hb A1C)  2. Hypertension associated with diabetes    3. Malignant neoplasm of prostate    4. Hyperlipidemia LDL goal <70     25 minutes spent discussing prostate cancer and the decisions that he has made. We also discussed continuing on his various diabetes medications in continuing to care for him in regard to his non-prostate related issues.

## 2012-06-01 ENCOUNTER — Telehealth: Payer: Self-pay | Admitting: *Deleted

## 2012-06-01 NOTE — Telephone Encounter (Signed)
CALLED PATIENT TO INFORM OF GOLD SEED PLACEMENT FOR 06-29-12 AT 10:15 AM, AT DR. BORDEN'S OFFICE AND HIS HORMONE INJ. FOR 06-02-12 AND HIS SIM ON 08-05-12 AT 10:00 AM AT DR. MANNING'S OFFICE, LVM FOR A RETURN CALL

## 2012-06-01 NOTE — Telephone Encounter (Signed)
Phoned patient to inform of appt. On 06-29-12 to discuss hormone therapy, I told patient that [facility] would call me about his gold seeds and I will get back with him after and let him know the day and time

## 2012-06-02 ENCOUNTER — Encounter: Payer: Medicare Other | Admitting: Family Medicine

## 2012-06-02 DIAGNOSIS — C61 Malignant neoplasm of prostate: Secondary | ICD-10-CM | POA: Diagnosis not present

## 2012-06-03 ENCOUNTER — Telehealth: Payer: Self-pay

## 2012-06-03 NOTE — Telephone Encounter (Signed)
PATIENT CALL STATING THAT HIS MEDS HAD NOT BEEN REFILLED THAT THIS HAPPENS ALL THE TIME THAT Adrian Bass NEEDS TO GET THIS RIGHT PT VERY RUDE I LOOKED UP HIS REFILLS THEY WERE DONE ON THE 8 TH PT SAID THEY WERE NOT THAT SOMEONE NEEDED TO DO IT NOW I TOLD HIM I WOULD TAKE CARE OF IT HE SAID IT NEEDS TO BE DONE . I CALLED PT PHARMACY [facility] TALKED WITH THE PHARMACIST SHE SAID ALL MED WAS FILLED 05/25/12 AND WAS IN THE BULK MED AREA I CALLED PT BACK TO LET HIM KNOW HIS MED WAS THERE AND READY FOR HIM TO PICK UP HE SAID IT WASN'T SO I SAID ONCE AGAIN THE PHARMACY HAS YOUR MEDICNIE READY AND HUNG THE PHONE UP

## 2012-06-06 NOTE — Addendum Note (Signed)
Encounter addended by: Lowella Petties, RN on: 06/06/2012  8:14 AM<BR>     Documentation filed: Charges VN

## 2012-06-29 DIAGNOSIS — C61 Malignant neoplasm of prostate: Secondary | ICD-10-CM | POA: Diagnosis not present

## 2012-06-29 DIAGNOSIS — N401 Enlarged prostate with lower urinary tract symptoms: Secondary | ICD-10-CM | POA: Diagnosis not present

## 2012-06-29 DIAGNOSIS — IMO0002 Reserved for concepts with insufficient information to code with codable children: Secondary | ICD-10-CM | POA: Diagnosis not present

## 2012-07-06 ENCOUNTER — Telehealth: Payer: Self-pay | Admitting: *Deleted

## 2012-07-06 NOTE — Telephone Encounter (Signed)
Called patient to give info., lvm for a return call. 

## 2012-07-06 NOTE — Telephone Encounter (Signed)
Called patient to inform that sim appt. Has been moved to 08-12-12 at 1:00 pm, spoke with patient and he is aware of this appt. Change and he is o.k. With it.

## 2012-08-03 DIAGNOSIS — H40019 Open angle with borderline findings, low risk, unspecified eye: Secondary | ICD-10-CM | POA: Diagnosis not present

## 2012-08-03 DIAGNOSIS — H2510 Age-related nuclear cataract, unspecified eye: Secondary | ICD-10-CM | POA: Diagnosis not present

## 2012-08-05 ENCOUNTER — Ambulatory Visit: Payer: Medicare Other | Admitting: Radiation Oncology

## 2012-08-11 ENCOUNTER — Telehealth: Payer: Self-pay | Admitting: *Deleted

## 2012-08-11 NOTE — Telephone Encounter (Signed)
CALLED PATIENT TO REMIND OF TOMORROW'S APPT. AND TO ASK A QUESTION, LVM FOR A RETURN CALL

## 2012-08-12 ENCOUNTER — Ambulatory Visit
Admission: RE | Admit: 2012-08-12 | Discharge: 2012-08-12 | Disposition: A | Discharge status: Home / Self Care | Payer: Medicare Other | Source: Ambulatory Visit | Attending: Radiation Oncology | Admitting: Radiation Oncology

## 2012-08-12 ENCOUNTER — Telehealth: Payer: Self-pay | Admitting: Radiation Oncology

## 2012-08-12 ENCOUNTER — Encounter: Payer: Self-pay | Admitting: Radiation Oncology

## 2012-08-12 DIAGNOSIS — Z510 Encounter for antineoplastic radiation therapy: Secondary | ICD-10-CM | POA: Diagnosis not present

## 2012-08-12 DIAGNOSIS — C61 Malignant neoplasm of prostate: Secondary | ICD-10-CM | POA: Insufficient documentation

## 2012-08-12 DIAGNOSIS — R300 Dysuria: Secondary | ICD-10-CM | POA: Insufficient documentation

## 2012-08-12 DIAGNOSIS — R350 Frequency of micturition: Secondary | ICD-10-CM | POA: Diagnosis not present

## 2012-08-12 NOTE — Progress Notes (Signed)
  Radiation Oncology         ([phone] ________________________________  Name: [name] MRN: [id number]  Date: 08/12/2012  DOB: 1941/03/06  SIMULATION AND TREATMENT PLANNING NOTE  DIAGNOSIS:  72 y.o. 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  Identity was confirmed.  All relevant records and images related to the planned course of therapy were reviewed.  The patient freely provided informed written consent to proceed with treatment after reviewing the details related to the planned course of therapy. The consent form was witnessed and verified by the simulation staff.  Then, the patient was set-up in a stable reproducible supine position for radiation therapy.  A vacuum lock pillow device was custom fabricated to position his legs in a reproducible immobilized position.  Then, I performed a urethrogram under sterile conditions to identify the prostatic apex.  CT images were obtained.  Surface markings were placed.  The CT images were loaded into the planning software.  Then the prostate target and avoidance structures including the rectum, bladder, bowel and hips were contoured.  Treatment planning then occurred.  The radiation prescription was entered and confirmed.  A total of one complex treatment device was fabricated. I have requested : Intensity Modulated Radiotherapy (IMRT) is medically necessary for this case for the following reason:  Rectal sparing.Marland Kitchen  PLAN:  The patient will receive 75 Gy in 40 fractions.  ________________________________  Artist Pais Kathrynn Running, M.D.

## 2012-08-12 NOTE — Telephone Encounter (Signed)
Met w patient to discuss RO billing. Pt had no financial concerns today.  Dx: Malignant neoplasm of prostate - Primary 185   Attending Rad:  MM   Rad Tx: IMRT

## 2012-08-19 DIAGNOSIS — C61 Malignant neoplasm of prostate: Secondary | ICD-10-CM | POA: Diagnosis not present

## 2012-08-19 DIAGNOSIS — Z510 Encounter for antineoplastic radiation therapy: Secondary | ICD-10-CM | POA: Diagnosis not present

## 2012-08-19 DIAGNOSIS — R300 Dysuria: Secondary | ICD-10-CM | POA: Diagnosis not present

## 2012-08-19 DIAGNOSIS — R350 Frequency of micturition: Secondary | ICD-10-CM | POA: Diagnosis not present

## 2012-08-22 ENCOUNTER — Ambulatory Visit
Admission: RE | Admit: 2012-08-22 | Discharge: 2012-08-22 | Disposition: A | Discharge status: Home / Self Care | Payer: Medicare Other | Source: Ambulatory Visit | Attending: Radiation Oncology | Admitting: Radiation Oncology

## 2012-08-22 DIAGNOSIS — Z510 Encounter for antineoplastic radiation therapy: Secondary | ICD-10-CM | POA: Diagnosis not present

## 2012-08-22 DIAGNOSIS — C61 Malignant neoplasm of prostate: Secondary | ICD-10-CM

## 2012-08-22 DIAGNOSIS — R350 Frequency of micturition: Secondary | ICD-10-CM | POA: Diagnosis not present

## 2012-08-22 DIAGNOSIS — R300 Dysuria: Secondary | ICD-10-CM | POA: Diagnosis not present

## 2012-08-22 NOTE — Progress Notes (Signed)
  Radiation Oncology         [phone] ________________________________  Name: Adrian Bass MRN: [id number]  Date: 08/22/2012  DOB: 1940-09-25  Simulation Verification Note  Status: outpatient  NARRATIVE: The patient was brought to the treatment unit and placed in the planned treatment position. The clinical setup was verified. Then port films were obtained and uploaded to the radiation oncology medical record software.  The treatment beams were carefully compared against the planned radiation fields. The position location and shape of the radiation fields was reviewed. They targeted volume of tissue appears to be appropriately covered by the radiation beams. Organs at risk appear to be excluded as planned.  Based on my personal review, I approved the simulation verification. The patient's treatment will proceed as planned.  ------------------------------------------------  Artist Pais Kathrynn Running, M.D.

## 2012-08-23 ENCOUNTER — Ambulatory Visit
Admission: RE | Admit: 2012-08-23 | Discharge: 2012-08-23 | Disposition: A | Discharge status: Home / Self Care | Payer: Medicare Other | Source: Ambulatory Visit | Attending: Radiation Oncology | Admitting: Radiation Oncology

## 2012-08-23 ENCOUNTER — Other Ambulatory Visit: Payer: Self-pay | Admitting: Family Medicine

## 2012-08-23 DIAGNOSIS — R350 Frequency of micturition: Secondary | ICD-10-CM | POA: Diagnosis not present

## 2012-08-23 DIAGNOSIS — R300 Dysuria: Secondary | ICD-10-CM | POA: Diagnosis not present

## 2012-08-23 DIAGNOSIS — C61 Malignant neoplasm of prostate: Secondary | ICD-10-CM | POA: Diagnosis not present

## 2012-08-23 DIAGNOSIS — Z510 Encounter for antineoplastic radiation therapy: Secondary | ICD-10-CM | POA: Diagnosis not present

## 2012-08-24 ENCOUNTER — Ambulatory Visit
Admission: RE | Admit: 2012-08-24 | Discharge: 2012-08-24 | Disposition: A | Discharge status: Home / Self Care | Payer: Medicare Other | Source: Ambulatory Visit | Attending: Radiation Oncology | Admitting: Radiation Oncology

## 2012-08-24 DIAGNOSIS — R300 Dysuria: Secondary | ICD-10-CM | POA: Diagnosis not present

## 2012-08-24 DIAGNOSIS — Z510 Encounter for antineoplastic radiation therapy: Secondary | ICD-10-CM | POA: Diagnosis not present

## 2012-08-24 DIAGNOSIS — C61 Malignant neoplasm of prostate: Secondary | ICD-10-CM | POA: Diagnosis not present

## 2012-08-24 DIAGNOSIS — R350 Frequency of micturition: Secondary | ICD-10-CM | POA: Diagnosis not present

## 2012-08-25 ENCOUNTER — Ambulatory Visit
Admission: RE | Admit: 2012-08-25 | Discharge: 2012-08-25 | Disposition: A | Discharge status: Home / Self Care | Payer: Medicare Other | Source: Ambulatory Visit | Attending: Radiation Oncology | Admitting: Radiation Oncology

## 2012-08-25 DIAGNOSIS — C61 Malignant neoplasm of prostate: Secondary | ICD-10-CM | POA: Diagnosis not present

## 2012-08-25 DIAGNOSIS — R350 Frequency of micturition: Secondary | ICD-10-CM | POA: Diagnosis not present

## 2012-08-25 DIAGNOSIS — R300 Dysuria: Secondary | ICD-10-CM | POA: Diagnosis not present

## 2012-08-25 DIAGNOSIS — Z510 Encounter for antineoplastic radiation therapy: Secondary | ICD-10-CM | POA: Diagnosis not present

## 2012-08-26 ENCOUNTER — Ambulatory Visit
Admission: RE | Admit: 2012-08-26 | Discharge: 2012-08-26 | Disposition: A | Discharge status: Home / Self Care | Payer: Medicare Other | Source: Ambulatory Visit | Attending: Radiation Oncology | Admitting: Radiation Oncology

## 2012-08-26 ENCOUNTER — Encounter: Payer: Self-pay | Admitting: Radiation Oncology

## 2012-08-26 VITALS — BP 134/82 | HR 83 | Temp 98.3°F | Ht 74.0 in | Wt 218.9 lb

## 2012-08-26 DIAGNOSIS — R300 Dysuria: Secondary | ICD-10-CM | POA: Diagnosis not present

## 2012-08-26 DIAGNOSIS — C61 Malignant neoplasm of prostate: Secondary | ICD-10-CM

## 2012-08-26 DIAGNOSIS — Z510 Encounter for antineoplastic radiation therapy: Secondary | ICD-10-CM | POA: Diagnosis not present

## 2012-08-26 DIAGNOSIS — R350 Frequency of micturition: Secondary | ICD-10-CM | POA: Diagnosis not present

## 2012-08-26 DIAGNOSIS — N3090 Cystitis, unspecified without hematuria: Secondary | ICD-10-CM

## 2012-08-26 NOTE — Progress Notes (Addendum)
Adrian Bass has received 5 fractions to his pelvis.  He C/o burning upon urination which he states has been present since prior to treatment.  He grades his dysuria as a level 7 on a scale of 0-10.  He states his urinary stream stops and starts as he reports that Tamsulosin does not help.  He admits to increase in urination since the start of treatment and he has nocturia 3-4 times.    He denies any proctitis or changes in his bowel habits.

## 2012-08-28 NOTE — Progress Notes (Signed)
  Radiation Oncology         [phone] ________________________________  Name: VEGA WITHROW MRN: [id number]  Date: 08/26/2012  DOB: 04/27/1941  Weekly Radiation Therapy Management  Current Dose: 5 Gy     Planned Dose:  75 Gy  Narrative . . . . . . . . The patient presents for routine under treatment assessment.  Mr Jafri has received 5 fractions to his pelvis. He C/o burning upon urination which he states has been present since prior to treatment. He grades his dysuria as a level 7 on a scale of 0-10. He states his urinary stream stops and starts as he reports that Tamsulosin does not help. He admits to increase in urination since the start of treatment and he has nocturia 3-4 times. He denies any proctitis or changes in his bowel habits.                                  Set-up films were reviewed.                                 The chart was checked. Physical Findings. . .  height is 6\' 2"  (1.88 m) and weight is 218 lb 14.4 oz (99.292 kg). His temperature is 98.3 F (36.8 C). His blood pressure is 134/82 and his pulse is 83. . Weight essentially stable.  No significant changes. Impression . . . . . . . The patient is  tolerating radiation. Plan . . . . . . . . . . . . Continue treatment as planned.  ________________________________  Artist Pais. Kathrynn Running, M.D.

## 2012-08-29 ENCOUNTER — Telehealth: Payer: Self-pay | Admitting: Oncology

## 2012-08-29 ENCOUNTER — Ambulatory Visit
Admission: RE | Admit: 2012-08-29 | Discharge: 2012-08-29 | Disposition: A | Discharge status: Home / Self Care | Payer: Medicare Other | Source: Ambulatory Visit | Attending: Radiation Oncology | Admitting: Radiation Oncology

## 2012-08-29 DIAGNOSIS — C61 Malignant neoplasm of prostate: Secondary | ICD-10-CM | POA: Diagnosis not present

## 2012-08-29 DIAGNOSIS — Z510 Encounter for antineoplastic radiation therapy: Secondary | ICD-10-CM | POA: Diagnosis not present

## 2012-08-29 DIAGNOSIS — R350 Frequency of micturition: Secondary | ICD-10-CM | POA: Diagnosis not present

## 2012-08-29 DIAGNOSIS — R300 Dysuria: Secondary | ICD-10-CM | POA: Diagnosis not present

## 2012-08-29 MED ORDER — PHENAZOPYRIDINE HCL 200 MG PO TABS: 200.0000 mg | ORAL_TABLET | Freq: Three times a day (TID) | ORAL | Status: DC | PRN

## 2012-08-29 NOTE — Addendum Note (Signed)
Encounter addended by: Oneita Hurt, MD on: 08/29/2012 12:36 PM<BR>     Documentation filed: Visit Diagnoses, Orders

## 2012-08-29 NOTE — Telephone Encounter (Signed)
Called Adrian Bass and let him know that his prescription for pyridium has been refilled and sent to the [facility] on [facility].

## 2012-08-30 ENCOUNTER — Ambulatory Visit
Admission: RE | Admit: 2012-08-30 | Discharge: 2012-08-30 | Disposition: A | Discharge status: Home / Self Care | Payer: Medicare Other | Source: Ambulatory Visit | Attending: Radiation Oncology | Admitting: Radiation Oncology

## 2012-08-30 DIAGNOSIS — R300 Dysuria: Secondary | ICD-10-CM | POA: Diagnosis not present

## 2012-08-30 DIAGNOSIS — C61 Malignant neoplasm of prostate: Secondary | ICD-10-CM | POA: Diagnosis not present

## 2012-08-30 DIAGNOSIS — R350 Frequency of micturition: Secondary | ICD-10-CM | POA: Diagnosis not present

## 2012-08-30 DIAGNOSIS — Z510 Encounter for antineoplastic radiation therapy: Secondary | ICD-10-CM | POA: Diagnosis not present

## 2012-08-31 ENCOUNTER — Encounter: Payer: Self-pay | Admitting: Radiation Oncology

## 2012-08-31 ENCOUNTER — Ambulatory Visit
Admission: RE | Admit: 2012-08-31 | Discharge: 2012-08-31 | Disposition: A | Discharge status: Home / Self Care | Payer: Medicare Other | Source: Ambulatory Visit | Attending: Radiation Oncology | Admitting: Radiation Oncology

## 2012-08-31 VITALS — BP 118/64 | HR 88 | Temp 98.3°F | Ht 74.0 in | Wt 219.2 lb

## 2012-08-31 DIAGNOSIS — C61 Malignant neoplasm of prostate: Secondary | ICD-10-CM | POA: Diagnosis not present

## 2012-08-31 DIAGNOSIS — R300 Dysuria: Secondary | ICD-10-CM | POA: Diagnosis not present

## 2012-08-31 DIAGNOSIS — R350 Frequency of micturition: Secondary | ICD-10-CM | POA: Diagnosis not present

## 2012-08-31 DIAGNOSIS — Z510 Encounter for antineoplastic radiation therapy: Secondary | ICD-10-CM | POA: Diagnosis not present

## 2012-08-31 MED ORDER — TAMSULOSIN HCL 0.4 MG PO CAPS: 0.4000 mg | ORAL_CAPSULE | Freq: Every day | ORAL | Status: DC

## 2012-08-31 NOTE — Progress Notes (Addendum)
  Radiation Oncology         [phone]) [phone] ________________________________  Name: Adrian Bass MRN: [id number]  Date: 08/31/2012  DOB: 1941-03-24  Weekly Radiation Therapy Management  Current Dose: 14.4 Gy     Planned Dose:  45 Gy  Narrative . . . . . . . . The patient presents for routine under treatment assessment.                                                Adrian Bass here for weekly put visit. He has had 8/25 fractions to his prostate. He denies pain and fatigue. He states that he is not having any burning on urination. He is taking the pyridium and it is really helping. He states that he does have urinary frequency and needs to get up 3/4 times during the night to urinate. He also stated that he was having frequent bowel movements and started taking imodium yesterday                                 Set-up films were reviewed.                                 The chart was checked. Physical Findings. . .  height is 6\' 2"  (1.88 m) and weight is 219 lb 3.2 oz (99.428 kg). His temperature is 98.3 F (36.8 C). His blood pressure is 118/64 and his pulse is 88. . Weight essentially stable.  No significant changes. Impression . . . . . . . The patient is  tolerating radiation. Plan . . . . . . . . . . . . Continue treatment as planned.  Given Flomax  ________________________________  Adrian Bass, M.D.

## 2012-08-31 NOTE — Progress Notes (Signed)
Adrian Bass here for weekly put visit.  He has had 8/25 fractions to his prostate.  He denies pain and fatigue.  He states that he is not having any burning on urination.  He is taking the pyridium and it is really helping.  He states that he does have urinary frequency and needs to get up 3/4 times during the night to urinate.  He also stated that he was having frequent bowel movements and started taking imodium yesterday.

## 2012-09-01 ENCOUNTER — Ambulatory Visit
Admission: RE | Admit: 2012-09-01 | Discharge: 2012-09-01 | Disposition: A | Discharge status: Home / Self Care | Payer: Medicare Other | Source: Ambulatory Visit | Attending: Radiation Oncology | Admitting: Radiation Oncology

## 2012-09-01 DIAGNOSIS — R350 Frequency of micturition: Secondary | ICD-10-CM | POA: Diagnosis not present

## 2012-09-01 DIAGNOSIS — R300 Dysuria: Secondary | ICD-10-CM | POA: Diagnosis not present

## 2012-09-01 DIAGNOSIS — Z510 Encounter for antineoplastic radiation therapy: Secondary | ICD-10-CM | POA: Diagnosis not present

## 2012-09-01 DIAGNOSIS — C61 Malignant neoplasm of prostate: Secondary | ICD-10-CM | POA: Diagnosis not present

## 2012-09-02 ENCOUNTER — Ambulatory Visit
Admission: RE | Admit: 2012-09-02 | Discharge: 2012-09-02 | Disposition: A | Discharge status: Home / Self Care | Payer: Medicare Other | Source: Ambulatory Visit | Attending: Radiation Oncology | Admitting: Radiation Oncology

## 2012-09-02 DIAGNOSIS — C61 Malignant neoplasm of prostate: Secondary | ICD-10-CM | POA: Diagnosis not present

## 2012-09-02 DIAGNOSIS — R300 Dysuria: Secondary | ICD-10-CM | POA: Diagnosis not present

## 2012-09-02 DIAGNOSIS — R350 Frequency of micturition: Secondary | ICD-10-CM | POA: Diagnosis not present

## 2012-09-02 DIAGNOSIS — Z510 Encounter for antineoplastic radiation therapy: Secondary | ICD-10-CM | POA: Diagnosis not present

## 2012-09-05 ENCOUNTER — Ambulatory Visit
Admission: RE | Admit: 2012-09-05 | Discharge: 2012-09-05 | Disposition: A | Discharge status: Home / Self Care | Payer: Medicare Other | Source: Ambulatory Visit | Attending: Radiation Oncology | Admitting: Radiation Oncology

## 2012-09-05 DIAGNOSIS — R300 Dysuria: Secondary | ICD-10-CM | POA: Diagnosis not present

## 2012-09-05 DIAGNOSIS — C61 Malignant neoplasm of prostate: Secondary | ICD-10-CM | POA: Diagnosis not present

## 2012-09-05 DIAGNOSIS — Z510 Encounter for antineoplastic radiation therapy: Secondary | ICD-10-CM | POA: Diagnosis not present

## 2012-09-05 DIAGNOSIS — R350 Frequency of micturition: Secondary | ICD-10-CM | POA: Diagnosis not present

## 2012-09-06 ENCOUNTER — Ambulatory Visit
Admission: RE | Admit: 2012-09-06 | Discharge: 2012-09-06 | Disposition: A | Discharge status: Home / Self Care | Payer: Medicare Other | Source: Ambulatory Visit | Attending: Radiation Oncology | Admitting: Radiation Oncology

## 2012-09-06 DIAGNOSIS — R350 Frequency of micturition: Secondary | ICD-10-CM | POA: Diagnosis not present

## 2012-09-06 DIAGNOSIS — R300 Dysuria: Secondary | ICD-10-CM | POA: Diagnosis not present

## 2012-09-06 DIAGNOSIS — Z510 Encounter for antineoplastic radiation therapy: Secondary | ICD-10-CM | POA: Diagnosis not present

## 2012-09-06 DIAGNOSIS — C61 Malignant neoplasm of prostate: Secondary | ICD-10-CM | POA: Diagnosis not present

## 2012-09-07 ENCOUNTER — Ambulatory Visit
Admission: RE | Admit: 2012-09-07 | Discharge: 2012-09-07 | Disposition: A | Discharge status: Home / Self Care | Payer: Medicare Other | Source: Ambulatory Visit | Attending: Radiation Oncology | Admitting: Radiation Oncology

## 2012-09-07 ENCOUNTER — Encounter: Payer: Self-pay | Admitting: Radiation Oncology

## 2012-09-07 ENCOUNTER — Ambulatory Visit: Admission: RE | Admit: 2012-09-07 | Payer: Medicare Other | Source: Ambulatory Visit

## 2012-09-07 VITALS — BP 126/78 | HR 77 | Temp 98.5°F | Ht 74.0 in | Wt 216.4 lb

## 2012-09-07 DIAGNOSIS — C61 Malignant neoplasm of prostate: Secondary | ICD-10-CM | POA: Diagnosis not present

## 2012-09-07 NOTE — Progress Notes (Signed)
  Radiation Oncology         [phone] ________________________________  Name: Adrian Bass MRN: [id number]  Date: 09/07/2012  DOB: 11/12/1940  Weekly Radiation Therapy Management  Current Dose: Reviewed  Narrative . . . . . . . . The patient presents for routine under treatment assessment.                                                The patient is without any complaint.                                 Set-up films were reviewed.                                 The chart was checked. Physical Findings. . .  height is 6\' 2"  (1.88 m) and weight is 216 lb 6.4 oz (98.158 kg). His temperature is 98.5 F (36.9 C). His blood pressure is 126/78 and his pulse is 77. . Weight essentially stable.  No significant changes. Impression . . . . . . . The patient is tolerating radiation. Plan . . . . . . . . . . . . Continue treatment as planned.  ________________________________  Artist Pais. Kathrynn Running, M.D.

## 2012-09-07 NOTE — Progress Notes (Addendum)
Mr. Tanzi here for weekly under treat visit.  He has had 13 fractions to his prostate.   He denies pain and fatigue.  He denies an increase in urinary frequency and urgency.  He denies hematuria and diarrhea.  He states that the pyridium was too strong.  It made him dizzy and lightheaded.  He started taking 1/2 tablet yesterday.

## 2012-09-08 ENCOUNTER — Ambulatory Visit
Admission: RE | Admit: 2012-09-08 | Discharge: 2012-09-08 | Disposition: A | Discharge status: Home / Self Care | Payer: Medicare Other | Source: Ambulatory Visit | Attending: Radiation Oncology | Admitting: Radiation Oncology

## 2012-09-08 DIAGNOSIS — R350 Frequency of micturition: Secondary | ICD-10-CM | POA: Diagnosis not present

## 2012-09-08 DIAGNOSIS — C61 Malignant neoplasm of prostate: Secondary | ICD-10-CM | POA: Diagnosis not present

## 2012-09-08 DIAGNOSIS — Z510 Encounter for antineoplastic radiation therapy: Secondary | ICD-10-CM | POA: Diagnosis not present

## 2012-09-08 DIAGNOSIS — R300 Dysuria: Secondary | ICD-10-CM | POA: Diagnosis not present

## 2012-09-09 ENCOUNTER — Ambulatory Visit
Admission: RE | Admit: 2012-09-09 | Discharge: 2012-09-09 | Disposition: A | Discharge status: Home / Self Care | Payer: Medicare Other | Source: Ambulatory Visit | Attending: Radiation Oncology | Admitting: Radiation Oncology

## 2012-09-09 DIAGNOSIS — C61 Malignant neoplasm of prostate: Secondary | ICD-10-CM | POA: Diagnosis not present

## 2012-09-09 DIAGNOSIS — Z510 Encounter for antineoplastic radiation therapy: Secondary | ICD-10-CM | POA: Diagnosis not present

## 2012-09-09 DIAGNOSIS — R350 Frequency of micturition: Secondary | ICD-10-CM | POA: Diagnosis not present

## 2012-09-09 DIAGNOSIS — R300 Dysuria: Secondary | ICD-10-CM | POA: Diagnosis not present

## 2012-09-12 ENCOUNTER — Ambulatory Visit
Admission: RE | Admit: 2012-09-12 | Discharge: 2012-09-12 | Disposition: A | Discharge status: Home / Self Care | Payer: Medicare Other | Source: Ambulatory Visit | Attending: Radiation Oncology | Admitting: Radiation Oncology

## 2012-09-12 DIAGNOSIS — C61 Malignant neoplasm of prostate: Secondary | ICD-10-CM | POA: Diagnosis not present

## 2012-09-12 DIAGNOSIS — Z510 Encounter for antineoplastic radiation therapy: Secondary | ICD-10-CM | POA: Diagnosis not present

## 2012-09-12 DIAGNOSIS — R350 Frequency of micturition: Secondary | ICD-10-CM | POA: Diagnosis not present

## 2012-09-12 DIAGNOSIS — R300 Dysuria: Secondary | ICD-10-CM | POA: Diagnosis not present

## 2012-09-13 ENCOUNTER — Ambulatory Visit
Admission: RE | Admit: 2012-09-13 | Discharge: 2012-09-13 | Disposition: A | Discharge status: Home / Self Care | Payer: Medicare Other | Source: Ambulatory Visit | Attending: Radiation Oncology | Admitting: Radiation Oncology

## 2012-09-13 DIAGNOSIS — Z510 Encounter for antineoplastic radiation therapy: Secondary | ICD-10-CM | POA: Diagnosis not present

## 2012-09-13 DIAGNOSIS — C61 Malignant neoplasm of prostate: Secondary | ICD-10-CM | POA: Diagnosis not present

## 2012-09-13 DIAGNOSIS — R350 Frequency of micturition: Secondary | ICD-10-CM | POA: Diagnosis not present

## 2012-09-13 DIAGNOSIS — R300 Dysuria: Secondary | ICD-10-CM | POA: Diagnosis not present

## 2012-09-14 ENCOUNTER — Ambulatory Visit
Admission: RE | Admit: 2012-09-14 | Discharge: 2012-09-14 | Disposition: A | Discharge status: Home / Self Care | Payer: Medicare Other | Source: Ambulatory Visit | Attending: Radiation Oncology | Admitting: Radiation Oncology

## 2012-09-14 ENCOUNTER — Encounter: Payer: Self-pay | Admitting: Radiation Oncology

## 2012-09-14 VITALS — BP 131/71 | HR 80 | Temp 98.2°F | Ht 74.0 in | Wt 216.9 lb

## 2012-09-14 DIAGNOSIS — C61 Malignant neoplasm of prostate: Secondary | ICD-10-CM

## 2012-09-14 DIAGNOSIS — R350 Frequency of micturition: Secondary | ICD-10-CM | POA: Diagnosis not present

## 2012-09-14 DIAGNOSIS — Z510 Encounter for antineoplastic radiation therapy: Secondary | ICD-10-CM | POA: Diagnosis not present

## 2012-09-14 DIAGNOSIS — R300 Dysuria: Secondary | ICD-10-CM | POA: Diagnosis not present

## 2012-09-14 NOTE — Progress Notes (Signed)
Adrian Bass is here for his weekly under treat visit.  He has had 17/25 fractions to his prostate.  He denies pain.  He denies any changes in urinary frequency.  He is getting up 2-4 times a night to urinate.  He still has burning with urination and is taking the pyridium with a little bit of relief.  He denies diarrhea and hematuria.

## 2012-09-14 NOTE — Progress Notes (Signed)
  Radiation Oncology         [phone]) [phone] ________________________________  Name: Adrian Bass MRN: [id number]  Date: 09/14/2012  DOB: [birth year]  Weekly Radiation Therapy Management  Current Dose: 30.6 Gy     Planned Dose:  75 Gy  Narrative . . . . . . . . The patient presents for routine under treatment assessment.                                              Adrian Bass is here for his weekly under treat visit. He has had 17/25 fractions to his prostate. He denies pain. He denies any changes in urinary frequency. He is getting up 2-4 times a night to urinate. He still has burning with urination and is taking the pyridium with a little bit of relief. He denies diarrhea and hematuria                                 Set-up films were reviewed.                                 The chart was checked. Physical Findings. . .  height is 6\' 2"  (1.88 m) and weight is 216 lb 14.4 oz (98.385 kg). His temperature is 98.2 F (36.8 C). His blood pressure is 131/71 and his pulse is 80. . Weight essentially stable.  No significant changes. Impression . . . . . . . The patient is tolerating radiation. Plan . . . . . . . . . . . . Continue treatment as planned.  ________________________________  Artist Pais. Kathrynn Running, M.D.

## 2012-09-15 ENCOUNTER — Other Ambulatory Visit: Payer: Self-pay | Admitting: Family Medicine

## 2012-09-15 ENCOUNTER — Ambulatory Visit
Admission: RE | Admit: 2012-09-15 | Discharge: 2012-09-15 | Disposition: A | Discharge status: Home / Self Care | Payer: Medicare Other | Source: Ambulatory Visit | Attending: Radiation Oncology | Admitting: Radiation Oncology

## 2012-09-15 DIAGNOSIS — R300 Dysuria: Secondary | ICD-10-CM | POA: Diagnosis not present

## 2012-09-15 DIAGNOSIS — R350 Frequency of micturition: Secondary | ICD-10-CM | POA: Diagnosis not present

## 2012-09-15 DIAGNOSIS — C61 Malignant neoplasm of prostate: Secondary | ICD-10-CM | POA: Diagnosis not present

## 2012-09-15 DIAGNOSIS — Z510 Encounter for antineoplastic radiation therapy: Secondary | ICD-10-CM | POA: Diagnosis not present

## 2012-09-16 ENCOUNTER — Ambulatory Visit
Admission: RE | Admit: 2012-09-16 | Discharge: 2012-09-16 | Disposition: A | Discharge status: Home / Self Care | Payer: Medicare Other | Source: Ambulatory Visit | Attending: Radiation Oncology | Admitting: Radiation Oncology

## 2012-09-16 DIAGNOSIS — R300 Dysuria: Secondary | ICD-10-CM | POA: Diagnosis not present

## 2012-09-16 DIAGNOSIS — C61 Malignant neoplasm of prostate: Secondary | ICD-10-CM | POA: Diagnosis not present

## 2012-09-16 DIAGNOSIS — R350 Frequency of micturition: Secondary | ICD-10-CM | POA: Diagnosis not present

## 2012-09-16 DIAGNOSIS — Z510 Encounter for antineoplastic radiation therapy: Secondary | ICD-10-CM | POA: Diagnosis not present

## 2012-09-19 ENCOUNTER — Ambulatory Visit
Admission: RE | Admit: 2012-09-19 | Discharge: 2012-09-19 | Disposition: A | Discharge status: Home / Self Care | Payer: Medicare Other | Source: Ambulatory Visit | Attending: Radiation Oncology | Admitting: Radiation Oncology

## 2012-09-19 DIAGNOSIS — R350 Frequency of micturition: Secondary | ICD-10-CM | POA: Diagnosis not present

## 2012-09-19 DIAGNOSIS — C61 Malignant neoplasm of prostate: Secondary | ICD-10-CM | POA: Diagnosis not present

## 2012-09-19 DIAGNOSIS — R300 Dysuria: Secondary | ICD-10-CM | POA: Diagnosis not present

## 2012-09-19 DIAGNOSIS — Z510 Encounter for antineoplastic radiation therapy: Secondary | ICD-10-CM | POA: Diagnosis not present

## 2012-09-20 ENCOUNTER — Ambulatory Visit
Admission: RE | Admit: 2012-09-20 | Discharge: 2012-09-20 | Disposition: A | Discharge status: Home / Self Care | Payer: Medicare Other | Source: Ambulatory Visit | Attending: Radiation Oncology | Admitting: Radiation Oncology

## 2012-09-20 DIAGNOSIS — R350 Frequency of micturition: Secondary | ICD-10-CM | POA: Diagnosis not present

## 2012-09-20 DIAGNOSIS — C61 Malignant neoplasm of prostate: Secondary | ICD-10-CM | POA: Diagnosis not present

## 2012-09-20 DIAGNOSIS — R300 Dysuria: Secondary | ICD-10-CM | POA: Diagnosis not present

## 2012-09-20 DIAGNOSIS — Z510 Encounter for antineoplastic radiation therapy: Secondary | ICD-10-CM | POA: Diagnosis not present

## 2012-09-21 ENCOUNTER — Ambulatory Visit
Admission: RE | Admit: 2012-09-21 | Discharge: 2012-09-21 | Disposition: A | Discharge status: Home / Self Care | Payer: Medicare Other | Source: Ambulatory Visit | Attending: Radiation Oncology | Admitting: Radiation Oncology

## 2012-09-21 DIAGNOSIS — R350 Frequency of micturition: Secondary | ICD-10-CM | POA: Diagnosis not present

## 2012-09-21 DIAGNOSIS — Z510 Encounter for antineoplastic radiation therapy: Secondary | ICD-10-CM | POA: Diagnosis not present

## 2012-09-21 DIAGNOSIS — C61 Malignant neoplasm of prostate: Secondary | ICD-10-CM | POA: Diagnosis not present

## 2012-09-21 DIAGNOSIS — R300 Dysuria: Secondary | ICD-10-CM | POA: Diagnosis not present

## 2012-09-22 ENCOUNTER — Encounter: Payer: Self-pay | Admitting: Radiation Oncology

## 2012-09-22 ENCOUNTER — Ambulatory Visit
Admission: RE | Admit: 2012-09-22 | Discharge: 2012-09-22 | Disposition: A | Discharge status: Home / Self Care | Payer: Medicare Other | Source: Ambulatory Visit | Attending: Radiation Oncology | Admitting: Radiation Oncology

## 2012-09-22 VITALS — BP 137/76 | HR 91 | Resp 20 | Wt 215.2 lb

## 2012-09-22 DIAGNOSIS — Z510 Encounter for antineoplastic radiation therapy: Secondary | ICD-10-CM | POA: Diagnosis not present

## 2012-09-22 DIAGNOSIS — C61 Malignant neoplasm of prostate: Secondary | ICD-10-CM | POA: Diagnosis not present

## 2012-09-22 DIAGNOSIS — R300 Dysuria: Secondary | ICD-10-CM | POA: Diagnosis not present

## 2012-09-22 DIAGNOSIS — R350 Frequency of micturition: Secondary | ICD-10-CM | POA: Diagnosis not present

## 2012-09-22 NOTE — Progress Notes (Addendum)
Pt requests to see dr today for weekly put, states he cannot stay to be seen tomorrow. Pt denies pain; he states his urinary issues "are the same as last week, no changes". Pt notified that Dr Kathrynn Running is seeing FU this afternoon.

## 2012-09-23 ENCOUNTER — Ambulatory Visit
Admission: RE | Admit: 2012-09-23 | Discharge: 2012-09-23 | Disposition: A | Discharge status: Home / Self Care | Payer: Medicare Other | Source: Ambulatory Visit | Attending: Radiation Oncology | Admitting: Radiation Oncology

## 2012-09-23 DIAGNOSIS — R300 Dysuria: Secondary | ICD-10-CM | POA: Diagnosis not present

## 2012-09-23 DIAGNOSIS — Z510 Encounter for antineoplastic radiation therapy: Secondary | ICD-10-CM | POA: Diagnosis not present

## 2012-09-23 DIAGNOSIS — C61 Malignant neoplasm of prostate: Secondary | ICD-10-CM | POA: Diagnosis not present

## 2012-09-23 DIAGNOSIS — R350 Frequency of micturition: Secondary | ICD-10-CM | POA: Diagnosis not present

## 2012-09-24 ENCOUNTER — Ambulatory Visit
Admission: RE | Admit: 2012-09-24 | Discharge: 2012-09-24 | Disposition: A | Discharge status: Home / Self Care | Payer: Medicare Other | Source: Ambulatory Visit | Attending: Radiation Oncology | Admitting: Radiation Oncology

## 2012-09-25 ENCOUNTER — Encounter: Payer: Self-pay | Admitting: Radiation Oncology

## 2012-09-25 NOTE — Progress Notes (Signed)
  Radiation Oncology         [phone] ________________________________  Name: Adrian Bass MRN: [id number]  Date: 09/22/2012  DOB: Nov 08, 1940  Weekly Radiation Therapy Management  Current Dose: 41.4 Gy     Planned Dose:  75 Gy   Narrative . . . . . . . . The patient presents for routine under treatment assessment.                                                      Pt requests to see me today for weekly put, states he cannot stay to be seen tomorrow. Pt denies pain; he states his urinary issues "are the same as last week, no changes".                                  Set-up films were reviewed.                                 The chart was checked. Physical Findings. . .  weight is 215 lb 3.2 oz (97.614 kg). His blood pressure is 137/76 and his pulse is 91. His respiration is 20. . Weight essentially stable.  No significant changes. Impression . . . . . . . The patient is tolerating radiation. Plan . . . . . . . . . . . . Continue treatment as planned.  ________________________________  Artist Pais. Kathrynn Running, M.D.

## 2012-09-26 ENCOUNTER — Ambulatory Visit
Admission: RE | Admit: 2012-09-26 | Discharge: 2012-09-26 | Disposition: A | Discharge status: Home / Self Care | Payer: Medicare Other | Source: Ambulatory Visit | Attending: Radiation Oncology | Admitting: Radiation Oncology

## 2012-09-26 DIAGNOSIS — R300 Dysuria: Secondary | ICD-10-CM | POA: Diagnosis not present

## 2012-09-26 DIAGNOSIS — R350 Frequency of micturition: Secondary | ICD-10-CM | POA: Diagnosis not present

## 2012-09-26 DIAGNOSIS — Z510 Encounter for antineoplastic radiation therapy: Secondary | ICD-10-CM | POA: Diagnosis not present

## 2012-09-26 DIAGNOSIS — C61 Malignant neoplasm of prostate: Secondary | ICD-10-CM | POA: Diagnosis not present

## 2012-09-27 ENCOUNTER — Ambulatory Visit
Admission: RE | Admit: 2012-09-27 | Discharge: 2012-09-27 | Disposition: A | Discharge status: Home / Self Care | Payer: Medicare Other | Source: Ambulatory Visit | Attending: Radiation Oncology | Admitting: Radiation Oncology

## 2012-09-27 DIAGNOSIS — R350 Frequency of micturition: Secondary | ICD-10-CM | POA: Diagnosis not present

## 2012-09-27 DIAGNOSIS — C61 Malignant neoplasm of prostate: Secondary | ICD-10-CM | POA: Diagnosis not present

## 2012-09-27 DIAGNOSIS — R300 Dysuria: Secondary | ICD-10-CM | POA: Diagnosis not present

## 2012-09-27 DIAGNOSIS — Z510 Encounter for antineoplastic radiation therapy: Secondary | ICD-10-CM | POA: Diagnosis not present

## 2012-09-28 ENCOUNTER — Ambulatory Visit
Admission: RE | Admit: 2012-09-28 | Discharge: 2012-09-28 | Disposition: A | Discharge status: Home / Self Care | Payer: Medicare Other | Source: Ambulatory Visit | Attending: Radiation Oncology | Admitting: Radiation Oncology

## 2012-09-28 DIAGNOSIS — C61 Malignant neoplasm of prostate: Secondary | ICD-10-CM | POA: Diagnosis not present

## 2012-09-28 DIAGNOSIS — R300 Dysuria: Secondary | ICD-10-CM | POA: Diagnosis not present

## 2012-09-28 DIAGNOSIS — R350 Frequency of micturition: Secondary | ICD-10-CM | POA: Diagnosis not present

## 2012-09-28 DIAGNOSIS — Z510 Encounter for antineoplastic radiation therapy: Secondary | ICD-10-CM | POA: Diagnosis not present

## 2012-09-29 ENCOUNTER — Ambulatory Visit
Admission: RE | Admit: 2012-09-29 | Discharge: 2012-09-29 | Disposition: A | Discharge status: Home / Self Care | Payer: Medicare Other | Source: Ambulatory Visit | Attending: Radiation Oncology | Admitting: Radiation Oncology

## 2012-09-29 DIAGNOSIS — Z510 Encounter for antineoplastic radiation therapy: Secondary | ICD-10-CM | POA: Diagnosis not present

## 2012-09-29 DIAGNOSIS — R300 Dysuria: Secondary | ICD-10-CM | POA: Diagnosis not present

## 2012-09-29 DIAGNOSIS — C61 Malignant neoplasm of prostate: Secondary | ICD-10-CM | POA: Diagnosis not present

## 2012-09-29 DIAGNOSIS — R350 Frequency of micturition: Secondary | ICD-10-CM | POA: Diagnosis not present

## 2012-09-30 ENCOUNTER — Ambulatory Visit
Admission: RE | Admit: 2012-09-30 | Discharge: 2012-09-30 | Disposition: A | Discharge status: Home / Self Care | Payer: Medicare Other | Source: Ambulatory Visit | Attending: Radiation Oncology | Admitting: Radiation Oncology

## 2012-09-30 ENCOUNTER — Encounter: Payer: Self-pay | Admitting: Radiation Oncology

## 2012-09-30 VITALS — BP 123/70 | HR 79 | Resp 18 | Wt 211.8 lb

## 2012-09-30 DIAGNOSIS — C61 Malignant neoplasm of prostate: Secondary | ICD-10-CM

## 2012-09-30 DIAGNOSIS — Z510 Encounter for antineoplastic radiation therapy: Secondary | ICD-10-CM | POA: Diagnosis not present

## 2012-09-30 DIAGNOSIS — R300 Dysuria: Secondary | ICD-10-CM | POA: Diagnosis not present

## 2012-09-30 DIAGNOSIS — R350 Frequency of micturition: Secondary | ICD-10-CM | POA: Diagnosis not present

## 2012-09-30 NOTE — Progress Notes (Signed)
  Radiation Oncology         [phone] ________________________________  Name: Adrian Bass MRN: [id number]  Date: 09/30/2012  DOB: Mar 18, 1941  Weekly Radiation Therapy Management  Current Dose: 53 Gy     Planned Dose:  75 Gy  Narrative . . . . . . . . The patient presents for routine under treatment assessment.                                                      The patient is without complaint.                                 Set-up films were reviewed.                                 The chart was checked. Physical Findings. . .  weight is 211 lb 12.8 oz (96.072 kg). His blood pressure is 123/70 and his pulse is 79. His respiration is 18. . Weight essentially stable.  No significant changes. Impression . . . . . . . The patient is  tolerating radiation. Plan . . . . . . . . . . . . Continue treatment as planned.  I advised to switch Flomax dose to evening.  ________________________________  Artist Pais. Kathrynn Running, M.D.

## 2012-09-30 NOTE — Progress Notes (Signed)
Patient presents to the clinic today unaccompanied for PUT with Dr. Manning. Patient alert and oriented to person, place, and time. No distress noted. Steady gait noted. Pleasant affect noted. Patient denies pain at this time. Patient reports, "things are the same this week as last week." Patient denies urinary frequency, urgency, or hematuria. Patient denies diarrhea. Patient denies frequency. Patient reports burning with urination continues for which he take pyridium with only mild relief. Patient reports on average he gets up 2-4 times per night to void. Patient denies fatigue. Reported all findings to Dr. Manning.        

## 2012-10-03 ENCOUNTER — Ambulatory Visit
Admission: RE | Admit: 2012-10-03 | Discharge: 2012-10-03 | Disposition: A | Discharge status: Home / Self Care | Payer: Medicare Other | Source: Ambulatory Visit | Attending: Radiation Oncology | Admitting: Radiation Oncology

## 2012-10-03 ENCOUNTER — Ambulatory Visit: Payer: Medicare Other | Admitting: Family Medicine

## 2012-10-03 DIAGNOSIS — R300 Dysuria: Secondary | ICD-10-CM | POA: Diagnosis not present

## 2012-10-03 DIAGNOSIS — R350 Frequency of micturition: Secondary | ICD-10-CM | POA: Diagnosis not present

## 2012-10-03 DIAGNOSIS — Z510 Encounter for antineoplastic radiation therapy: Secondary | ICD-10-CM | POA: Diagnosis not present

## 2012-10-03 DIAGNOSIS — C61 Malignant neoplasm of prostate: Secondary | ICD-10-CM | POA: Diagnosis not present

## 2012-10-04 ENCOUNTER — Ambulatory Visit
Admission: RE | Admit: 2012-10-04 | Discharge: 2012-10-04 | Disposition: A | Discharge status: Home / Self Care | Payer: Medicare Other | Source: Ambulatory Visit | Attending: Radiation Oncology | Admitting: Radiation Oncology

## 2012-10-04 DIAGNOSIS — R300 Dysuria: Secondary | ICD-10-CM | POA: Diagnosis not present

## 2012-10-04 DIAGNOSIS — R350 Frequency of micturition: Secondary | ICD-10-CM | POA: Diagnosis not present

## 2012-10-04 DIAGNOSIS — Z510 Encounter for antineoplastic radiation therapy: Secondary | ICD-10-CM | POA: Diagnosis not present

## 2012-10-04 DIAGNOSIS — C61 Malignant neoplasm of prostate: Secondary | ICD-10-CM | POA: Diagnosis not present

## 2012-10-05 ENCOUNTER — Ambulatory Visit
Admission: RE | Admit: 2012-10-05 | Discharge: 2012-10-05 | Disposition: A | Discharge status: Home / Self Care | Payer: Medicare Other | Source: Ambulatory Visit | Attending: Radiation Oncology | Admitting: Radiation Oncology

## 2012-10-05 DIAGNOSIS — R300 Dysuria: Secondary | ICD-10-CM | POA: Diagnosis not present

## 2012-10-05 DIAGNOSIS — C61 Malignant neoplasm of prostate: Secondary | ICD-10-CM | POA: Diagnosis not present

## 2012-10-05 DIAGNOSIS — Z510 Encounter for antineoplastic radiation therapy: Secondary | ICD-10-CM | POA: Diagnosis not present

## 2012-10-05 DIAGNOSIS — R350 Frequency of micturition: Secondary | ICD-10-CM | POA: Diagnosis not present

## 2012-10-06 ENCOUNTER — Ambulatory Visit
Admission: RE | Admit: 2012-10-06 | Discharge: 2012-10-06 | Disposition: A | Discharge status: Home / Self Care | Payer: Medicare Other | Source: Ambulatory Visit | Attending: Radiation Oncology | Admitting: Radiation Oncology

## 2012-10-06 DIAGNOSIS — R300 Dysuria: Secondary | ICD-10-CM | POA: Diagnosis not present

## 2012-10-06 DIAGNOSIS — C61 Malignant neoplasm of prostate: Secondary | ICD-10-CM | POA: Diagnosis not present

## 2012-10-06 DIAGNOSIS — Z510 Encounter for antineoplastic radiation therapy: Secondary | ICD-10-CM | POA: Diagnosis not present

## 2012-10-06 DIAGNOSIS — R350 Frequency of micturition: Secondary | ICD-10-CM | POA: Diagnosis not present

## 2012-10-07 ENCOUNTER — Encounter: Payer: Self-pay | Admitting: Radiation Oncology

## 2012-10-07 ENCOUNTER — Ambulatory Visit
Admission: RE | Admit: 2012-10-07 | Discharge: 2012-10-07 | Disposition: A | Discharge status: Home / Self Care | Payer: Medicare Other | Source: Ambulatory Visit | Attending: Radiation Oncology | Admitting: Radiation Oncology

## 2012-10-07 VITALS — BP 128/75 | HR 73 | Resp 18 | Wt 218.7 lb

## 2012-10-07 DIAGNOSIS — Z510 Encounter for antineoplastic radiation therapy: Secondary | ICD-10-CM | POA: Diagnosis not present

## 2012-10-07 DIAGNOSIS — C61 Malignant neoplasm of prostate: Secondary | ICD-10-CM | POA: Diagnosis not present

## 2012-10-07 DIAGNOSIS — R300 Dysuria: Secondary | ICD-10-CM | POA: Diagnosis not present

## 2012-10-07 DIAGNOSIS — R350 Frequency of micturition: Secondary | ICD-10-CM | POA: Diagnosis not present

## 2012-10-07 NOTE — Progress Notes (Signed)
  Radiation Oncology         [phone]) [phone] ________________________________  Name: Adrian Bass MRN: [id number]  Date: 10/07/2012  DOB: Jun 30, 1940  Weekly Radiation Therapy Management  Current Dose: 63 Gy     Planned Dose:  75 Gy  Narrative . . . . . . . . The patient presents for routine under treatment assessment.                                                      The patient is without complaint.                                 Set-up films were reviewed.                                 The chart was checked. Physical Findings. . .  weight is 218 lb 11.2 oz (99.202 kg). His blood pressure is 128/75 and his pulse is 73. His respiration is 18. . Weight essentially stable.  No significant changes. Impression . . . . . . . The patient is tolerating radiation. Plan . . . . . . . . . . . . Continue treatment as planned.  ________________________________  Artist Pais. Kathrynn Running, M.D.

## 2012-10-07 NOTE — Progress Notes (Signed)
Patient presents to the clinic today unaccompanied for PUT with Dr. Kathrynn Running. Patient alert and oriented to person, place, and time. No distress noted. Steady gait noted. Pleasant affect noted. Patient denies pain at this time. Patient reports, "things are the same this week as last week." Patient denies urinary frequency, urgency, or hematuria. Patient denies diarrhea. Patient denies frequency. Patient reports burning with urination continues for which he take pyridium with only mild relief. Patient reports on average he gets up 2-4 times per night to void. Patient denies fatigue. Reported all findings to Dr. Kathrynn Running.

## 2012-10-11 ENCOUNTER — Ambulatory Visit
Admission: RE | Admit: 2012-10-11 | Discharge: 2012-10-11 | Disposition: A | Discharge status: Home / Self Care | Payer: Medicare Other | Source: Ambulatory Visit | Attending: Radiation Oncology | Admitting: Radiation Oncology

## 2012-10-11 DIAGNOSIS — R300 Dysuria: Secondary | ICD-10-CM | POA: Diagnosis not present

## 2012-10-11 DIAGNOSIS — Z510 Encounter for antineoplastic radiation therapy: Secondary | ICD-10-CM | POA: Diagnosis not present

## 2012-10-11 DIAGNOSIS — C61 Malignant neoplasm of prostate: Secondary | ICD-10-CM | POA: Diagnosis not present

## 2012-10-11 DIAGNOSIS — R350 Frequency of micturition: Secondary | ICD-10-CM | POA: Diagnosis not present

## 2012-10-12 ENCOUNTER — Ambulatory Visit
Admission: RE | Admit: 2012-10-12 | Discharge: 2012-10-12 | Disposition: A | Discharge status: Home / Self Care | Payer: Medicare Other | Source: Ambulatory Visit | Attending: Radiation Oncology | Admitting: Radiation Oncology

## 2012-10-12 ENCOUNTER — Ambulatory Visit: Payer: Medicare Other | Admitting: Family Medicine

## 2012-10-12 DIAGNOSIS — C61 Malignant neoplasm of prostate: Secondary | ICD-10-CM | POA: Diagnosis not present

## 2012-10-12 DIAGNOSIS — R300 Dysuria: Secondary | ICD-10-CM | POA: Diagnosis not present

## 2012-10-12 DIAGNOSIS — Z510 Encounter for antineoplastic radiation therapy: Secondary | ICD-10-CM | POA: Diagnosis not present

## 2012-10-12 DIAGNOSIS — R350 Frequency of micturition: Secondary | ICD-10-CM | POA: Diagnosis not present

## 2012-10-13 ENCOUNTER — Ambulatory Visit
Admission: RE | Admit: 2012-10-13 | Discharge: 2012-10-13 | Disposition: A | Discharge status: Home / Self Care | Payer: Medicare Other | Source: Ambulatory Visit | Attending: Radiation Oncology | Admitting: Radiation Oncology

## 2012-10-13 DIAGNOSIS — R300 Dysuria: Secondary | ICD-10-CM | POA: Diagnosis not present

## 2012-10-13 DIAGNOSIS — Z510 Encounter for antineoplastic radiation therapy: Secondary | ICD-10-CM | POA: Diagnosis not present

## 2012-10-13 DIAGNOSIS — C61 Malignant neoplasm of prostate: Secondary | ICD-10-CM | POA: Diagnosis not present

## 2012-10-13 DIAGNOSIS — R350 Frequency of micturition: Secondary | ICD-10-CM | POA: Diagnosis not present

## 2012-10-14 ENCOUNTER — Encounter: Payer: Self-pay | Admitting: Radiation Oncology

## 2012-10-14 ENCOUNTER — Ambulatory Visit
Admission: RE | Admit: 2012-10-14 | Discharge: 2012-10-14 | Disposition: A | Discharge status: Home / Self Care | Payer: Medicare Other | Source: Ambulatory Visit | Attending: Radiation Oncology | Admitting: Radiation Oncology

## 2012-10-14 VITALS — BP 121/65 | HR 81 | Resp 18 | Wt 212.7 lb

## 2012-10-14 DIAGNOSIS — Z510 Encounter for antineoplastic radiation therapy: Secondary | ICD-10-CM | POA: Diagnosis not present

## 2012-10-14 DIAGNOSIS — C61 Malignant neoplasm of prostate: Secondary | ICD-10-CM | POA: Diagnosis not present

## 2012-10-14 DIAGNOSIS — R350 Frequency of micturition: Secondary | ICD-10-CM | POA: Diagnosis not present

## 2012-10-14 DIAGNOSIS — R300 Dysuria: Secondary | ICD-10-CM | POA: Diagnosis not present

## 2012-10-14 NOTE — Progress Notes (Signed)
  Radiation Oncology         [phone] ________________________________  Name: Adrian Bass MRN: [id number]  Date: 10/14/2012  DOB: 1940-09-12  Weekly Radiation Therapy Management  Current Dose: 71 Gy     Planned Dose:  75 Gy  Narrative . . . . . . . . The patient presents for routine under treatment assessment.                                   The patient is without complaint except ongoing frequent nocturia.                                 Set-up films were reviewed.                                 The chart was checked. Physical Findings. . .  weight is 212 lb 11.2 oz (96.48 kg). His blood pressure is 121/65 and his pulse is 81. His respiration is 18. . Weight essentially stable.  No significant changes. Impression . . . . . . . The patient is  tolerating radiation. Plan . . . . . . . . . . . . Continue treatment as planned.  ________________________________  Artist Pais. Kathrynn Running, M.D.

## 2012-10-14 NOTE — Progress Notes (Signed)
Patient denies pain at this time. Patient reports, "things are the same this week as last week." Patient denies urinary frequency, urgency, or hematuria. Patient denies diarrhea. Patient denies frequency. Patient reports burning with urination continues for which he take pyridium with only mild relief. Patient reports on average he gets up 2-4 times per night to void. Patient continues to take flomax as directed. Patient denies changes since last week. Patient denies fatigue. Reported all findings to Dr. Kathrynn Running

## 2012-10-17 ENCOUNTER — Ambulatory Visit
Admission: RE | Admit: 2012-10-17 | Discharge: 2012-10-17 | Disposition: A | Discharge status: Home / Self Care | Payer: Medicare Other | Source: Ambulatory Visit | Attending: Radiation Oncology | Admitting: Radiation Oncology

## 2012-10-17 DIAGNOSIS — C61 Malignant neoplasm of prostate: Secondary | ICD-10-CM | POA: Diagnosis not present

## 2012-10-17 DIAGNOSIS — Z510 Encounter for antineoplastic radiation therapy: Secondary | ICD-10-CM | POA: Diagnosis not present

## 2012-10-17 DIAGNOSIS — F329 Major depressive disorder, single episode, unspecified: Secondary | ICD-10-CM | POA: Diagnosis not present

## 2012-10-17 DIAGNOSIS — I10 Essential (primary) hypertension: Secondary | ICD-10-CM | POA: Diagnosis not present

## 2012-10-17 DIAGNOSIS — R300 Dysuria: Secondary | ICD-10-CM | POA: Diagnosis not present

## 2012-10-17 DIAGNOSIS — E785 Hyperlipidemia, unspecified: Secondary | ICD-10-CM | POA: Diagnosis not present

## 2012-10-17 DIAGNOSIS — E1169 Type 2 diabetes mellitus with other specified complication: Secondary | ICD-10-CM | POA: Diagnosis not present

## 2012-10-17 DIAGNOSIS — R350 Frequency of micturition: Secondary | ICD-10-CM | POA: Diagnosis not present

## 2012-10-18 ENCOUNTER — Ambulatory Visit
Admission: RE | Admit: 2012-10-18 | Discharge: 2012-10-18 | Disposition: A | Discharge status: Home / Self Care | Payer: Medicare Other | Source: Ambulatory Visit | Attending: Radiation Oncology | Admitting: Radiation Oncology

## 2012-10-18 ENCOUNTER — Encounter: Payer: Self-pay | Admitting: Family Medicine

## 2012-10-18 ENCOUNTER — Encounter: Payer: Self-pay | Admitting: Radiation Oncology

## 2012-10-18 DIAGNOSIS — C61 Malignant neoplasm of prostate: Secondary | ICD-10-CM | POA: Diagnosis not present

## 2012-10-18 DIAGNOSIS — R350 Frequency of micturition: Secondary | ICD-10-CM | POA: Diagnosis not present

## 2012-10-18 DIAGNOSIS — E119 Type 2 diabetes mellitus without complications: Secondary | ICD-10-CM | POA: Diagnosis not present

## 2012-10-18 DIAGNOSIS — Z510 Encounter for antineoplastic radiation therapy: Secondary | ICD-10-CM | POA: Diagnosis not present

## 2012-10-18 DIAGNOSIS — R300 Dysuria: Secondary | ICD-10-CM | POA: Diagnosis not present

## 2012-10-19 NOTE — Progress Notes (Signed)
  Radiation Oncology         [phone] ________________________________  Name: Adrian Bass MRN: [id number]  Date: 10/18/2012  DOB: Mar 16, 1941  End of Treatment Note  Diagnosis:  72 y.o. 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  The prostate, seminal vesicles, and pelvic lymph nodes were initially treated to 45 Gy in 25 fractions of 1.8 Gy. 2.  The prostate and proximal seminal vesicles were boosted to 75 Gy with 15 additional fractions of 2 Gy.  Beams/energy:    1.  The patient was set-up in a stable reproducible supine position for radiation therapy using a custom shaped vacuum lock pillow device to position his legs in a reproducible immobilized position. Daily cone beam CT was used to position the patient and verify proper bladder/rectal filling.  The prostate, seminal vesicles, and pelvic lymph nodes were initially treated using 2 VMAT RapidArc beams delivering 6 MV x-rays. 2.  The prostate and proximal seminal vesicles were boosted using the same set-up and image guidance with 2 VMAT RapidArc reduced fields.  Narrative: The patient tolerated radiation treatment relatively well.   He has had nocturia throughout, but, no other significant complaints.  Plan: The patient has completed radiation treatment. The patient will return to radiation oncology clinic for routine followup in one month. I advised him to call or return sooner if he has any questions or concerns related to his recovery or treatment. ________________________________  Artist Pais. Kathrynn Running, M.D.

## 2012-10-28 ENCOUNTER — Ambulatory Visit: Payer: Medicare Other | Admitting: Internal Medicine

## 2012-11-08 DIAGNOSIS — E1169 Type 2 diabetes mellitus with other specified complication: Secondary | ICD-10-CM | POA: Diagnosis not present

## 2012-11-09 ENCOUNTER — Other Ambulatory Visit: Payer: Self-pay | Admitting: Family Medicine

## 2012-11-10 ENCOUNTER — Other Ambulatory Visit: Payer: Self-pay | Admitting: Family Medicine

## 2012-11-10 NOTE — Telephone Encounter (Signed)
DR.LALONDE IS HE STILL OUR PT

## 2012-11-21 ENCOUNTER — Ambulatory Visit: Payer: Medicare Other | Admitting: Internal Medicine

## 2012-11-23 ENCOUNTER — Encounter: Payer: Self-pay | Admitting: Radiation Oncology

## 2012-11-23 NOTE — Progress Notes (Signed)
  Radiation Oncology         [phone] ________________________________  Name: Adrian Bass MRN: [id number]  Date: 11/24/2012  DOB: 02-23-1941  Follow-Up Visit Note  CC: Nicki Reaper, NP  Crecencio Mc, MD  Diagnosis:   72 y.o. 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  He continues to take pyridium and flomax. He denies burning with urination.  He reports a medium urine stream, and on average he gets up 1-3 times per night to void. He reports frequency has greatly improved. He denies pain, hematuria, diarrhea, blood in stool or pain associated with bowel movements                              ALLERGIES:  has No Known Allergies.  Meds: Current Outpatient Prescriptions  Medication Sig Dispense Refill  . atorvastatin (LIPITOR) 40 MG tablet TAKE ONE TABLET BY MOUTH ONE TIME DAILY  30 tablet  11  . metFORMIN (GLUCOPHAGE) 850 MG tablet TAKE ONE TABLET BY MOUTH TWICE DAILY  60 tablet  0  . metoprolol succinate (TOPROL-XL) 25 MG 24 hr tablet Take 1 tablet (25 mg total) by mouth daily.  30 tablet  5  . naproxen sodium (ANAPROX) 220 MG tablet Take 220 mg by mouth 2 (two) times daily with a meal.      . olmesartan-hydrochlorothiazide (BENICAR HCT) 40-25 MG per tablet Take 1 tablet by mouth daily.  30 tablet  11  . phenazopyridine (PYRIDIUM) 200 MG tablet Take 1 tablet (200 mg total) by mouth 3 (three) times daily as needed for pain.  90 tablet  2  . sertraline (ZOLOFT) 50 MG tablet Take 1 tablet (50 mg total) by mouth daily.  30 tablet  0  . tamsulosin (FLOMAX) 0.4 MG CAPS Take 1 capsule (0.4 mg total) by mouth daily after supper.  30 capsule  5  . vardenafil (LEVITRA) 20 MG tablet Take 1 tablet (20 mg total) by mouth as needed for erectile dysfunction.  40 tablet  5   No current facility-administered medications for this  encounter.    Physical Findings: The patient is in no acute distress. Patient is alert and oriented.  weight is 213 lb 3.2 oz (96.707 kg). His oral temperature is 97 F (36.1 C). His blood pressure is 126/75 and his pulse is 93. His respiration is 18 and oxygen saturation is 100%. .  No significant changes.  Impression:  The patient is recovering from the effects of radiation.   Plan:  He will continue to follow-up with urology for ongoing PSA determinations.  I will look forward to following his response through their correspondence, and be happy to participate in care if clinically indicated.  I talked to the patient about what to expect in the future, including his risk for erectile dysfunction and rectal bleeding.  I encouraged him to call or return to the office if he has any question about his previous radiation or possible radiation effects.  He was comfortable with this plan.   _____________________________________  Artist Pais. Kathrynn Running, M.D.

## 2012-11-24 ENCOUNTER — Other Ambulatory Visit (INDEPENDENT_AMBULATORY_CARE_PROVIDER_SITE_OTHER): Payer: Medicare Other

## 2012-11-24 ENCOUNTER — Encounter: Payer: Self-pay | Admitting: Radiation Oncology

## 2012-11-24 ENCOUNTER — Ambulatory Visit (INDEPENDENT_AMBULATORY_CARE_PROVIDER_SITE_OTHER): Payer: Medicare Other | Admitting: Internal Medicine

## 2012-11-24 ENCOUNTER — Encounter: Payer: Self-pay | Admitting: Internal Medicine

## 2012-11-24 ENCOUNTER — Ambulatory Visit
Admission: RE | Admit: 2012-11-24 | Discharge: 2012-11-24 | Disposition: A | Discharge status: Home / Self Care | Payer: Medicare Other | Source: Ambulatory Visit | Attending: Radiation Oncology | Admitting: Radiation Oncology

## 2012-11-24 VITALS — BP 126/75 | HR 93 | Temp 97.0°F | Resp 18 | Wt 213.2 lb

## 2012-11-24 VITALS — BP 122/68 | HR 94 | Temp 97.8°F | Ht 72.0 in | Wt 214.0 lb

## 2012-11-24 DIAGNOSIS — E1110 Type 2 diabetes mellitus with ketoacidosis without coma: Secondary | ICD-10-CM

## 2012-11-24 DIAGNOSIS — E785 Hyperlipidemia, unspecified: Secondary | ICD-10-CM | POA: Diagnosis not present

## 2012-11-24 DIAGNOSIS — I10 Essential (primary) hypertension: Secondary | ICD-10-CM

## 2012-11-24 DIAGNOSIS — E1310 Other specified diabetes mellitus with ketoacidosis without coma: Secondary | ICD-10-CM | POA: Diagnosis not present

## 2012-11-24 DIAGNOSIS — N529 Male erectile dysfunction, unspecified: Secondary | ICD-10-CM

## 2012-11-24 DIAGNOSIS — E669 Obesity, unspecified: Secondary | ICD-10-CM

## 2012-11-24 DIAGNOSIS — C61 Malignant neoplasm of prostate: Secondary | ICD-10-CM

## 2012-11-24 LAB — CBC
MCHC: 35.1 g/dL (ref 30.0–36.0)
MCV: 92.2 fl (ref 78.0–100.0)
Platelets: 184 10*3/uL (ref 150.0–400.0)

## 2012-11-24 LAB — LIPID PANEL
Total CHOL/HDL Ratio: 7
Triglycerides: 1043 mg/dL — ABNORMAL HIGH (ref 0.0–149.0)

## 2012-11-24 LAB — COMPREHENSIVE METABOLIC PANEL
AST: 32 U/L (ref 0–37)
Albumin: 4.3 g/dL (ref 3.5–5.2)
Alkaline Phosphatase: 79 U/L (ref 39–117)
BUN: 33 mg/dL — ABNORMAL HIGH (ref 6–23)
Potassium: 3.9 mEq/L (ref 3.5–5.1)
Sodium: 130 mEq/L — ABNORMAL LOW (ref 135–145)

## 2012-11-24 LAB — HEMOGLOBIN A1C: Hgb A1c MFr Bld: 6.7 % — ABNORMAL HIGH (ref 4.6–6.5)

## 2012-11-24 LAB — MICROALBUMIN / CREATININE URINE RATIO: Microalb Creat Ratio: 0.4 mg/g (ref 0.0–30.0)

## 2012-11-24 NOTE — Assessment & Plan Note (Signed)
Will recheck lipid profile today Would like to start an aspirin daily Continue current meds

## 2012-11-24 NOTE — Assessment & Plan Note (Signed)
Will recheck A1C and microalbumin today Continue metformin for now

## 2012-11-24 NOTE — Patient Instructions (Signed)
Health Maintenance, Males A healthy lifestyle and preventative care can promote health and wellness.  Maintain regular health, dental, and eye exams.  Eat a healthy diet. Foods like vegetables, fruits, whole grains, low-fat dairy products, and lean protein foods contain the nutrients you need without too many calories. Decrease your intake of foods high in solid fats, added sugars, and salt. Get information about a proper diet from your caregiver, if necessary.  Regular physical exercise is one of the most important things you can do for your health. Most adults should get at least 150 minutes of moderate-intensity exercise (any activity that increases your heart rate and causes you to sweat) each week. In addition, most adults need muscle-strengthening exercises on 2 or more days a week.   Maintain a healthy weight. The body mass index (BMI) is a screening tool to identify possible weight problems. It provides an estimate of body fat based on height and weight. Your caregiver can help determine your BMI, and can help you achieve or maintain a healthy weight. For adults 20 years and older:  A BMI below 18.5 is considered underweight.  A BMI of 18.5 to 24.9 is normal.  A BMI of 25 to 29.9 is considered overweight.  A BMI of 30 and above is considered obese.  Maintain normal blood lipids and cholesterol by exercising and minimizing your intake of saturated fat. Eat a balanced diet with plenty of fruits and vegetables. Blood tests for lipids and cholesterol should begin at age 20 and be repeated every 5 years. If your lipid or cholesterol levels are high, you are over 50, or you are a high risk for heart disease, you may need your cholesterol levels checked more frequently.Ongoing high lipid and cholesterol levels should be treated with medicines, if diet and exercise are not effective.  If you smoke, find out from your caregiver how to quit. If you do not use tobacco, do not start.  If you  choose to drink alcohol, do not exceed 2 drinks per day. One drink is considered to be 12 ounces (355 mL) of beer, 5 ounces (148 mL) of wine, or 1.5 ounces (44 mL) of liquor.  Avoid use of street drugs. Do not share needles with anyone. Ask for help if you need support or instructions about stopping the use of drugs.  High blood pressure causes heart disease and increases the risk of stroke. Blood pressure should be checked at least every 1 to 2 years. Ongoing high blood pressure should be treated with medicines if weight loss and exercise are not effective.  If you are 45 to 71 years old, ask your caregiver if you should take aspirin to prevent heart disease.  Diabetes screening involves taking a blood sample to check your fasting blood sugar level. This should be done once every 3 years, after age 45, if you are within normal weight and without risk factors for diabetes. Testing should be considered at a younger age or be carried out more frequently if you are overweight and have at least 1 risk factor for diabetes.  Colorectal cancer can be detected and often prevented. Most routine colorectal cancer screening begins at the age of 50 and continues through age 75. However, your caregiver may recommend screening at an earlier age if you have risk factors for colon cancer. On a yearly basis, your caregiver may provide home test kits to check for hidden blood in the stool. Use of a small camera at the end of a tube,   to directly examine the colon (sigmoidoscopy or colonoscopy), can detect the earliest forms of colorectal cancer. Talk to your caregiver about this at age 50, when routine screening begins. Direct examination of the colon should be repeated every 5 to 10 years through age 75, unless early forms of pre-cancerous polyps or small growths are found.  Hepatitis C blood testing is recommended for all people born from 1945 through 1965 and any individual with known risks for hepatitis C.  Healthy  men should no longer receive prostate-specific antigen (PSA) blood tests as part of routine cancer screening. Consult with your caregiver about prostate cancer screening.  Testicular cancer screening is not recommended for adolescents or adult males who have no symptoms. Screening includes self-exam, caregiver exam, and other screening tests. Consult with your caregiver about any symptoms you have or any concerns you have about testicular cancer.  Practice safe sex. Use condoms and avoid high-risk sexual practices to reduce the spread of sexually transmitted infections (STIs).  Use sunscreen with a sun protection factor (SPF) of 30 or greater. Apply sunscreen liberally and repeatedly throughout the day. You should seek shade when your shadow is shorter than you. Protect yourself by wearing long sleeves, pants, a wide-brimmed hat, and sunglasses year round, whenever you are outdoors.  Notify your caregiver of new moles or changes in moles, especially if there is a change in shape or color. Also notify your caregiver if a mole is larger than the size of a pencil eraser.  A one-time screening for abdominal aortic aneurysm (AAA) and surgical repair of large AAAs by sound wave imaging (ultrasonography) is recommended for ages 65 to 75 years who are current or former smokers.  Stay current with your immunizations. Document Released: 10/31/2007 Document Revised: 07/27/2011 Document Reviewed: 09/29/2010 [employer] Patient Information 2014 [employer].  

## 2012-11-24 NOTE — Assessment & Plan Note (Signed)
Takes Levitra as needed

## 2012-11-24 NOTE — Assessment & Plan Note (Signed)
Work on diet by counting calories Maintain a 1800 calorie diet

## 2012-11-24 NOTE — Progress Notes (Signed)
HPI  Pt presents to the clinic today to establish care. He is transferring care from Dr. Susann Givens. He does have some concerns today about his medications. He feels like he does not need to take any of his medicines.   Flu: never Pneumovax: 2008 Zostavax: 2008 Td: 2008 Eye doctor: biannually Dentist: biannually Colonoscopy: 2008  Past Medical History  Diagnosis Date  . Hypertension   . Hyperlipidemia   . Adenocarcinoma   . Prostate cancer 04/25/12    Gleason 4+3=7,&4+5=9,PSA=70.71,Volume=104.6cc  . BPH with obstruction/lower urinary tract symptoms   . Diverticulosis     hx  . Diabetes mellitus     Type II    Current Outpatient Prescriptions  Medication Sig Dispense Refill  . atorvastatin (LIPITOR) 40 MG tablet TAKE ONE TABLET BY MOUTH ONE TIME DAILY  30 tablet  11  . metFORMIN (GLUCOPHAGE) 850 MG tablet TAKE ONE TABLET BY MOUTH TWICE DAILY  60 tablet  0  . metoprolol succinate (TOPROL-XL) 25 MG 24 hr tablet Take 1 tablet (25 mg total) by mouth daily.  30 tablet  5  . naproxen sodium (ANAPROX) 220 MG tablet Take 220 mg by mouth 2 (two) times daily with a meal.      . olmesartan-hydrochlorothiazide (BENICAR HCT) 40-25 MG per tablet Take 1 tablet by mouth daily.  30 tablet  11  . phenazopyridine (PYRIDIUM) 200 MG tablet Take 1 tablet (200 mg total) by mouth 3 (three) times daily as needed for pain.  90 tablet  2  . sertraline (ZOLOFT) 50 MG tablet Take 1 tablet (50 mg total) by mouth daily.  30 tablet  0  . tamsulosin (FLOMAX) 0.4 MG CAPS Take 1 capsule (0.4 mg total) by mouth daily after supper.  30 capsule  5  . vardenafil (LEVITRA) 20 MG tablet Take 1 tablet (20 mg total) by mouth as needed for erectile dysfunction.  40 tablet  5   No current facility-administered medications for this visit.    No Known Allergies  Family History  Problem Relation Age of Onset  . Heart disease Mother   . Heart disease Father   . Cancer Neg Hx   . Depression Neg Hx   . Stroke Neg Hx      History   Social History  . Marital Status: Married    Spouse Name: N/A    Number of Children: N/A  . Years of Education: N/A   Occupational History  .      Sales/Marketing   Social History Main Topics  . Smoking status: Never Smoker   . Smokeless tobacco: Never Used  . Alcohol Use: 0.6 oz/week    1 Glasses of wine per week     Comment: social 2x week  . Drug Use: No  . Sexually Active: Yes   Other Topics Concern  . Not on file   Social History Narrative  . No narrative on file    ROS:  Constitutional: Denies fever, malaise, fatigue, headache or abrupt weight changes.  HEENT: Denies eye pain, eye redness, ear pain, ringing in the ears, wax buildup, runny nose, nasal congestion, bloody nose, or sore throat. Respiratory: Denies difficulty breathing, shortness of breath, cough or sputum production.   Cardiovascular: Denies chest pain, chest tightness, palpitations or swelling in the hands or feet.  Gastrointestinal: Denies abdominal pain, bloating, constipation, diarrhea or blood in the stool.  GU: Denies frequency, urgency, pain with urination, blood in urine, odor or discharge. Musculoskeletal: Denies decrease in range of motion,  difficulty with gait, muscle pain or joint pain and swelling.  Skin: Denies redness, rashes, lesions or ulcercations.  Neurological: Denies dizziness, difficulty with memory, difficulty with speech or problems with balance and coordination.   No other specific complaints in a complete review of systems (except as listed in HPI above).  PE:  BP 122/68  Pulse 94  Temp(Src) 97.8 F (36.6 C) (Oral)  Ht 6' (1.829 m)  Wt 214 lb (97.07 kg)  BMI 29.02 kg/m2  SpO2 95% Wt Readings from Last 3 Encounters:  11/24/12 214 lb (97.07 kg)  11/24/12 213 lb 3.2 oz (96.707 kg)  10/14/12 212 lb 11.2 oz (96.48 kg)    General: Appears his stated age, obese but well developed, well nourished in NAD. HEENT: Head: normal shape and size; Eyes: sclera  white, no icterus, conjunctiva pink, PERRLA and EOMs intact; Ears: Tm's gray and intact, normal light reflex; Nose: mucosa pink and moist, septum midline; Throat/Mouth: Teeth present, mucosa pink and moist, no lesions or ulcerations noted.  Neck: Normal range of motion. Neck supple, trachea midline. No massses, lumps or thyromegaly present.  Cardiovascular: Normal rate and rhythm. S1,S2 noted.  No murmur, rubs or gallops noted. No JVD or BLE edema. No carotid bruits noted. Pulmonary/Chest: Normal effort and positive vesicular breath sounds. No respiratory distress. No wheezes, rales or ronchi noted.  Abdomen: Soft and nontender. Normal bowel sounds, no bruits noted. No distention or masses noted. Liver, spleen and kidneys non palpable. Musculoskeletal: Normal range of motion. No signs of joint swelling. No difficulty with gait.  Neurological: Alert and oriented. Cranial nerves II-XII intact. Coordination normal. +DTRs bilaterally. Psychiatric: Mood and affect normal. Behavior is normal. Judgment and thought content normal.   EKG:  BMET    Component Value Date/Time   NA 138 07/20/2011 1019   K 4.0 07/20/2011 1019   CL 101 07/20/2011 1019   CO2 25 07/20/2011 1019   GLUCOSE 209* 07/20/2011 1019   BUN 20 07/20/2011 1019   CREATININE 1.53* 07/20/2011 1019   CALCIUM 9.5 07/20/2011 1019    Lipid Panel     Component Value Date/Time   CHOL 152 07/20/2011 1019   TRIG 220* 07/20/2011 1019   HDL 43 07/20/2011 1019   CHOLHDL 3.5 07/20/2011 1019   VLDL 44* 07/20/2011 1019   LDLCALC 65 07/20/2011 1019    CBC    Component Value Date/Time   WBC 6.0 07/20/2011 1019   RBC 4.80 07/20/2011 1019   HGB 13.1 07/20/2011 1019   HCT 39.9 07/20/2011 1019   PLT 230 07/20/2011 1019   MCV 83.1 07/20/2011 1019   MCH 27.3 07/20/2011 1019   MCHC 32.8 07/20/2011 1019   RDW 13.5 07/20/2011 1019   LYMPHSABS 1.2 07/20/2011 1019   MONOABS 0.5 07/20/2011 1019   EOSABS 0.1 07/20/2011 1019   BASOSABS 0.0 07/20/2011 1019    Hgb A1C Lab Results  Component  Value Date   HGBA1C 7.2 05/31/2012     Assessment and Plan:

## 2012-11-24 NOTE — Progress Notes (Signed)
Reports that he continues to take pyridium and flomax. Denies burning with urination. Reports a medium urine stream. Reports on average he gets up 1-3 times per night to void. Reports frequency has greatly improved. Denies pain at this time. Denies hematuria. Denies diarrhea, blood in stool or pain associated with bowel movements.

## 2012-11-24 NOTE — Assessment & Plan Note (Signed)
Well controlled on current meds Will check labs today Avoid salt in his diet

## 2012-11-29 ENCOUNTER — Other Ambulatory Visit: Payer: Self-pay | Admitting: Internal Medicine

## 2012-11-29 ENCOUNTER — Telehealth: Payer: Self-pay | Admitting: *Deleted

## 2012-11-29 DIAGNOSIS — C61 Malignant neoplasm of prostate: Secondary | ICD-10-CM

## 2012-11-29 MED ORDER — METOPROLOL SUCCINATE ER 25 MG PO TB24: 25.0000 mg | ORAL_TABLET | Freq: Every day | ORAL | Status: DC

## 2012-11-29 MED ORDER — ATORVASTATIN CALCIUM 40 MG PO TABS: ORAL_TABLET | ORAL | Status: DC

## 2012-11-29 MED ORDER — SERTRALINE HCL 50 MG PO TABS: 50.0000 mg | ORAL_TABLET | Freq: Every day | ORAL | Status: DC

## 2012-11-29 MED ORDER — CHOLINE FENOFIBRATE 135 MG PO CPDR: 135.0000 mg | DELAYED_RELEASE_CAPSULE | Freq: Every day | ORAL | Status: DC

## 2012-11-29 MED ORDER — OLMESARTAN MEDOXOMIL-HCTZ 40-25 MG PO TABS: 1.0000 | ORAL_TABLET | Freq: Every day | ORAL | Status: DC

## 2012-11-29 MED ORDER — TAMSULOSIN HCL 0.4 MG PO CAPS: 0.4000 mg | ORAL_CAPSULE | Freq: Every day | ORAL | Status: DC

## 2012-11-29 MED ORDER — METFORMIN HCL 850 MG PO TABS: ORAL_TABLET | ORAL | Status: DC

## 2012-11-29 NOTE — Telephone Encounter (Signed)
eRx done, I need to see him back in 3 months to recheck lipids

## 2012-11-29 NOTE — Telephone Encounter (Signed)
He does have a stable anemia, likely as a results of his cancer treatment. His A1C, a measure of his diabetes in 6.7%, this is doing good, but he does need to continue his Metformin. His cholesterol is well controlled on the lipitor but his triglycerides are very elevated at 1043 (normal < 150). I need to start him on a medication to lower his triglycerides, if not, he is at very high risk for gallstones and pancreatitis. I would like to put him on trilipix. If this is ok with him, I can call it into his pharmacy. He can have a copy of his results if he would like. As far as his questions, he can make an OV if he wants to discuss any questions he may have.

## 2012-11-29 NOTE — Telephone Encounter (Signed)
Pt called requesting his lab results and also states he has questions to ask.  He states he wants to speak with Rene Kocher would not go into detail as to what his questions were.  Please advise

## 2012-11-29 NOTE — Telephone Encounter (Signed)
Spoke with pt advised him of result note.  Pt agreed to the Trilipix Rx.  He also request refills on all other medications.

## 2012-12-12 ENCOUNTER — Encounter: Payer: Self-pay | Admitting: Internal Medicine

## 2012-12-12 ENCOUNTER — Ambulatory Visit (INDEPENDENT_AMBULATORY_CARE_PROVIDER_SITE_OTHER): Payer: Medicare Other | Admitting: Internal Medicine

## 2012-12-12 VITALS — BP 150/82 | HR 91 | Wt 216.0 lb

## 2012-12-12 DIAGNOSIS — E785 Hyperlipidemia, unspecified: Secondary | ICD-10-CM | POA: Diagnosis not present

## 2012-12-12 DIAGNOSIS — E1169 Type 2 diabetes mellitus with other specified complication: Secondary | ICD-10-CM | POA: Diagnosis not present

## 2012-12-12 DIAGNOSIS — E781 Pure hyperglyceridemia: Secondary | ICD-10-CM | POA: Diagnosis not present

## 2012-12-12 DIAGNOSIS — I10 Essential (primary) hypertension: Secondary | ICD-10-CM

## 2012-12-12 DIAGNOSIS — E1159 Type 2 diabetes mellitus with other circulatory complications: Secondary | ICD-10-CM

## 2012-12-12 DIAGNOSIS — E119 Type 2 diabetes mellitus without complications: Secondary | ICD-10-CM | POA: Diagnosis not present

## 2012-12-12 MED ORDER — CHOLINE FENOFIBRATE 135 MG PO CPDR: 135.0000 mg | DELAYED_RELEASE_CAPSULE | Freq: Every day | ORAL | Status: DC

## 2012-12-12 MED ORDER — GLIPIZIDE 10 MG PO TABS: 10.0000 mg | ORAL_TABLET | Freq: Two times a day (BID) | ORAL | Status: DC

## 2012-12-12 NOTE — Assessment & Plan Note (Signed)
Elevated Add trilipx to current statin

## 2012-12-12 NOTE — Assessment & Plan Note (Signed)
Well controlled but with increase kidney function Will d/c metformin and start glipizide

## 2012-12-12 NOTE — Assessment & Plan Note (Signed)
Elevated today likely due to stress Practice stress relieving techniques

## 2012-12-12 NOTE — Progress Notes (Signed)
Subjective:    Patient ID: Adrian Bass, male    DOB: 01-24-1941, 72 y.o.   MRN: [id number]  HPI  Pt presents to the clinic today to discuss his most recent lab results and his current medications. He would like to be taken off all medications that it is not absolutely necessary he be on.   Review of Systems      Past Medical History  Diagnosis Date  . Hypertension   . Hyperlipidemia   . Adenocarcinoma   . Prostate cancer 04/25/12    Gleason 4+3=7,&4+5=9,PSA=70.71,Volume=104.6cc  . BPH with obstruction/lower urinary tract symptoms   . Diverticulosis     hx  . Diabetes mellitus     Type II    Current Outpatient Prescriptions  Medication Sig Dispense Refill  . atorvastatin (LIPITOR) 40 MG tablet TAKE ONE TABLET BY MOUTH ONE TIME DAILY  30 tablet  11  . Choline Fenofibrate 135 MG capsule Take 1 capsule (135 mg total) by mouth daily.  30 capsule  3  . metFORMIN (GLUCOPHAGE) 850 MG tablet TAKE ONE TABLET BY MOUTH TWICE DAILY  60 tablet  0  . metoprolol succinate (TOPROL-XL) 25 MG 24 hr tablet Take 1 tablet (25 mg total) by mouth daily.  30 tablet  5  . naproxen sodium (ANAPROX) 220 MG tablet Take 220 mg by mouth 2 (two) times daily with a meal.      . olmesartan-hydrochlorothiazide (BENICAR HCT) 40-25 MG per tablet Take 1 tablet by mouth daily.  30 tablet  11  . phenazopyridine (PYRIDIUM) 200 MG tablet Take 1 tablet (200 mg total) by mouth 3 (three) times daily as needed for pain.  90 tablet  2  . sertraline (ZOLOFT) 50 MG tablet Take 1 tablet (50 mg total) by mouth daily.  30 tablet  0  . tamsulosin (FLOMAX) 0.4 MG CAPS Take 1 capsule (0.4 mg total) by mouth daily after supper.  30 capsule  5  . vardenafil (LEVITRA) 20 MG tablet Take 1 tablet (20 mg total) by mouth as needed for erectile dysfunction.  40 tablet  5   No current facility-administered medications for this visit.    No Known Allergies  Family History  Problem Relation Age of Onset  . Heart disease Mother   .  Heart disease Father   . Cancer Neg Hx   . Depression Neg Hx   . Stroke Neg Hx     History   Social History  . Marital Status: Married    Spouse Name: N/A    Number of Children: N/A  . Years of Education: N/A   Occupational History  .      Sales/Marketing   Social History Main Topics  . Smoking status: Never Smoker   . Smokeless tobacco: Never Used  . Alcohol Use: 0.6 oz/week    1 Glasses of wine per week     Comment: social 2x week  . Drug Use: No  . Sexually Active: Yes   Other Topics Concern  . Not on file   Social History Narrative  . No narrative on file     Constitutional: Denies fever, malaise, fatigue, headache or abrupt weight changes.  Respiratory: Denies difficulty breathing, shortness of breath, cough or sputum production.   Cardiovascular: Denies chest pain, chest tightness, palpitations or swelling in the hands or feet.   Neurological: Denies dizziness, difficulty with memory, difficulty with speech or problems with balance and coordination.   No other specific complaints in a complete  review of systems (except as listed in HPI above).  Objective:   Physical Exam  BP 150/82  Pulse 91  Wt 216 lb (97.977 kg)  BMI 29.29 kg/m2  SpO2 94% Wt Readings from Last 3 Encounters:  12/12/12 216 lb (97.977 kg)  11/24/12 214 lb (97.07 kg)  11/24/12 213 lb 3.2 oz (96.707 kg)    General: Appears his stated age, well developed, well nourished in NAD.Marland Kitchen  Cardiovascular: Normal rate and rhythm. S1,S2 noted.  No murmur, rubs or gallops noted. No JVD or BLE edema. No carotid bruits noted. Pulmonary/Chest: Normal effort and positive vesicular breath sounds. No respiratory distress. No wheezes, rales or ronchi noted.  Neurological: Alert and oriented. Cranial nerves II-XII intact. Coordination normal. +DTRs bilaterally.     BMET    Component Value Date/Time   NA 130* 11/24/2012 1351   K 3.9 11/24/2012 1351   CL 97 11/24/2012 1351   CO2 30 11/24/2012 1351    GLUCOSE 158* 11/24/2012 1351   BUN 33* 11/24/2012 1351   CREATININE 1.6* 11/24/2012 1351   CREATININE 1.53* 07/20/2011 1019   CALCIUM 9.3 11/24/2012 1351    Lipid Panel     Component Value Date/Time   CHOL 266* 11/24/2012 1351   TRIG 1043.0 Triglyceride is over 400; calculations on Lipids are invalid.* 11/24/2012 1351   HDL 40.10 11/24/2012 1351   CHOLHDL 7 11/24/2012 1351   VLDL 208.6* 11/24/2012 1351   LDLCALC 65 07/20/2011 1019    CBC    Component Value Date/Time   WBC 3.2* 11/24/2012 1351   RBC 3.34* 11/24/2012 1351   HGB 10.8* 11/24/2012 1351   HCT 30.8* 11/24/2012 1351   PLT 184.0 11/24/2012 1351   MCV 92.2 11/24/2012 1351   MCH 27.3 07/20/2011 1019   MCHC 35.1 11/24/2012 1351   RDW 14.9* 11/24/2012 1351   LYMPHSABS 1.2 07/20/2011 1019   MONOABS 0.5 07/20/2011 1019   EOSABS 0.1 07/20/2011 1019   BASOSABS 0.0 07/20/2011 1019    Hgb A1C Lab Results  Component Value Date   HGBA1C 6.7* 11/24/2012         Assessment & Plan:

## 2012-12-16 ENCOUNTER — Telehealth: Payer: Self-pay | Admitting: *Deleted

## 2012-12-16 NOTE — Telephone Encounter (Signed)
Pt called was transferred to Triage.  Demanded to speak with Nicki Reaper, when advised she was in with a pt and that I could assist him he became rude.  He did not want to give/spell last name or give birth date so his chart could be pulled up adequately. He asked for the name of the joint pain medication that he was to discontinue.  I advised him that Naproxen was on his AVS to be discontinued.  He hung up.

## 2012-12-21 IMAGING — US US AORTA SCREENING (MEDICARE)
1 series · 14 of 17 positions shown · non-contrast
Comparison: None.

CLINICAL DATA: Hypertension, screening for abdominal aortic
aneurysm.

ULTRASOUND OF ABDOMINAL AORTA
TECHNIQUE: Ultrasound examination of the abdominal aorta was
performed to evaluate for abdominal aortic aneurysm.

[Series 1: us aorta screening (medicare) · 0.30mm/px · 14 of 17 slices shown]
[im 1/17]
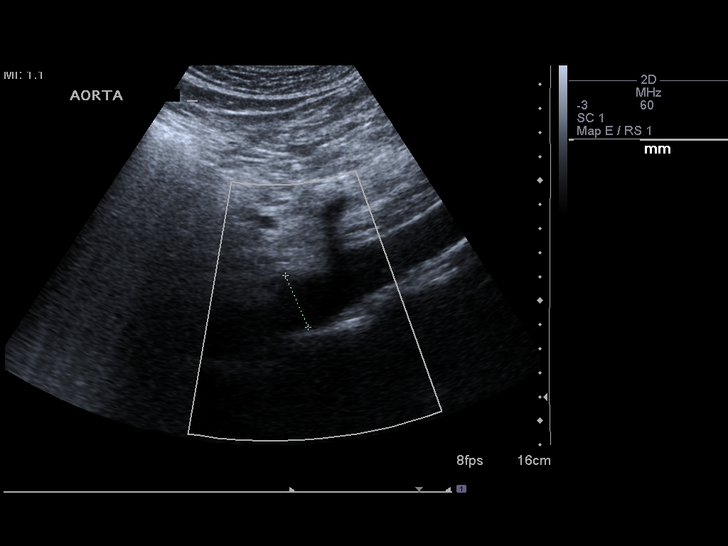
[im 2/17]
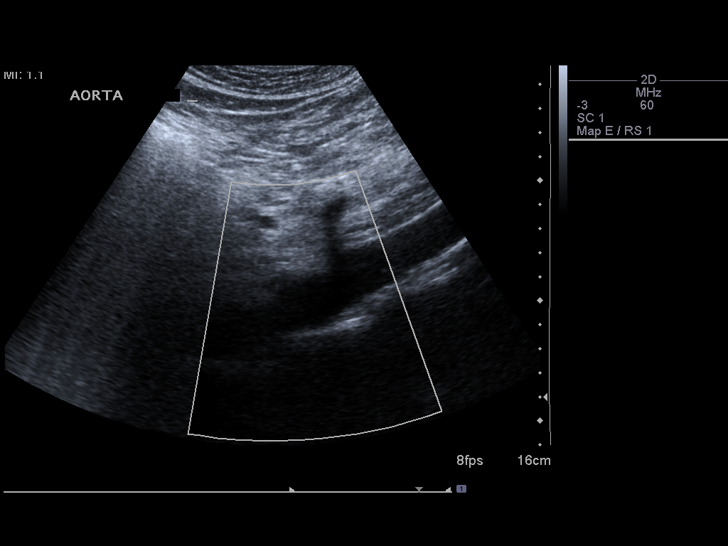
[im 4/17]
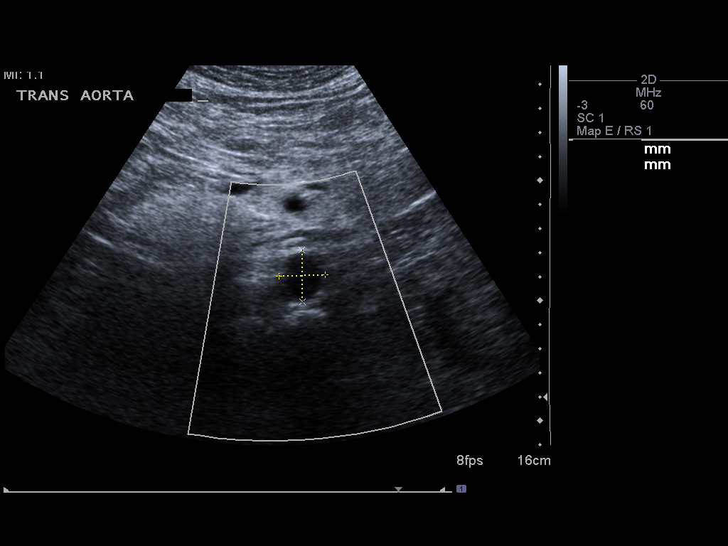
[im 5/17]
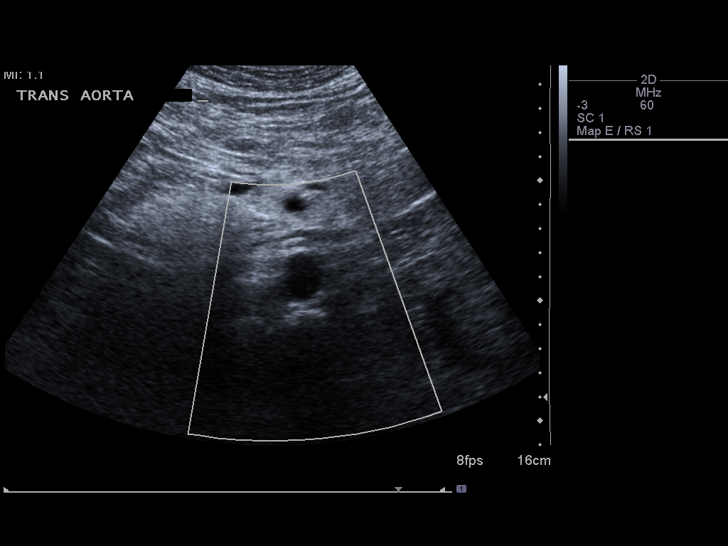
[im 6/17]
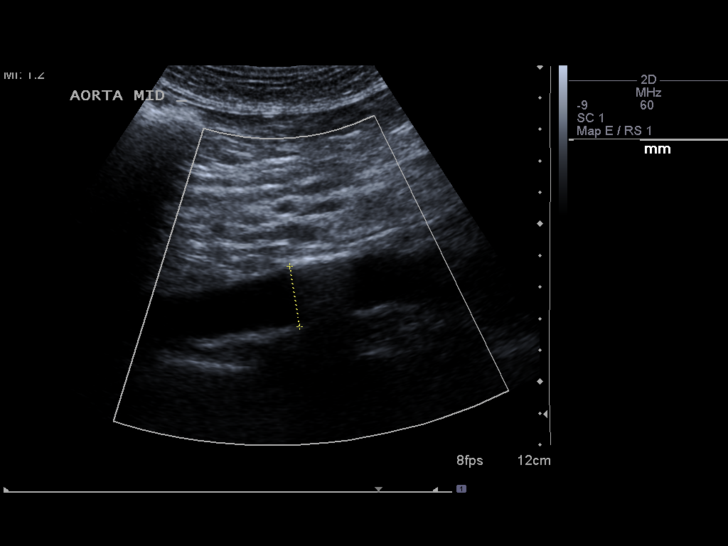
[im 7/17]
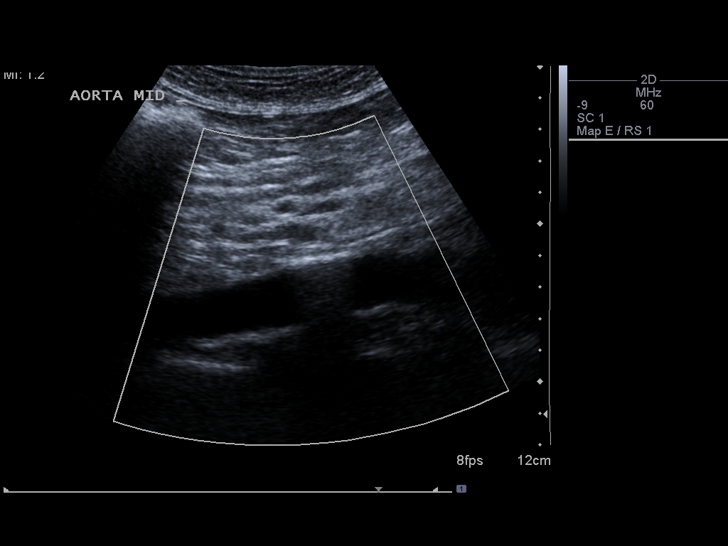
[im 8/17]
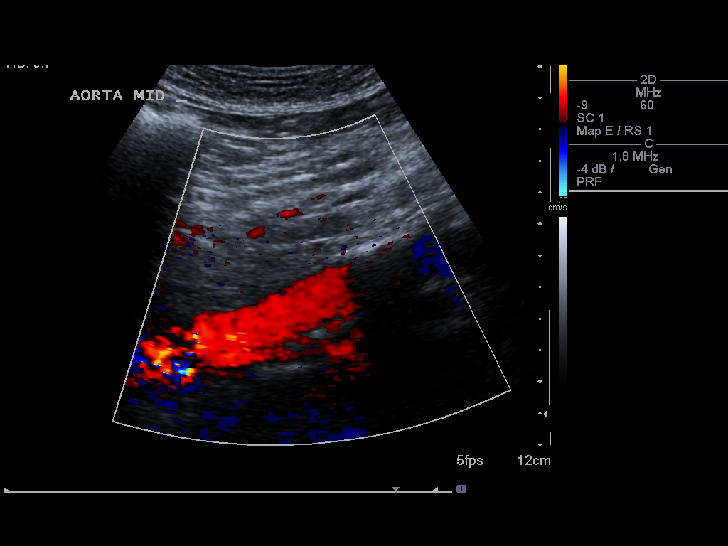
[im 10/17]
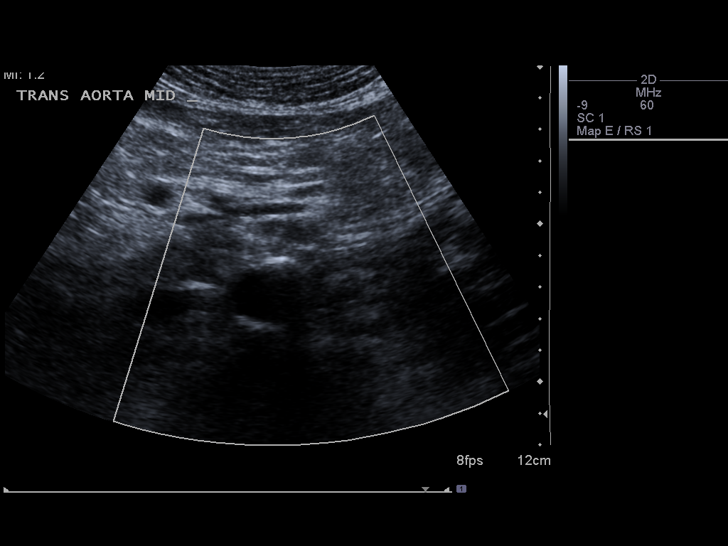
[im 11/17]
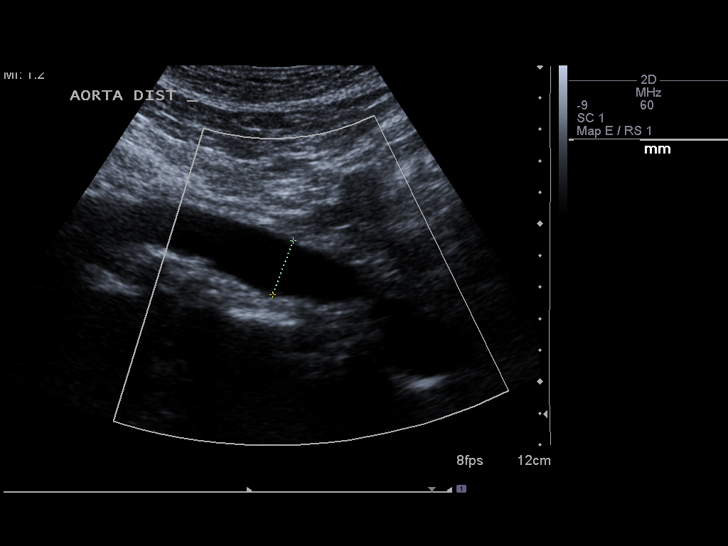
[im 12/17]
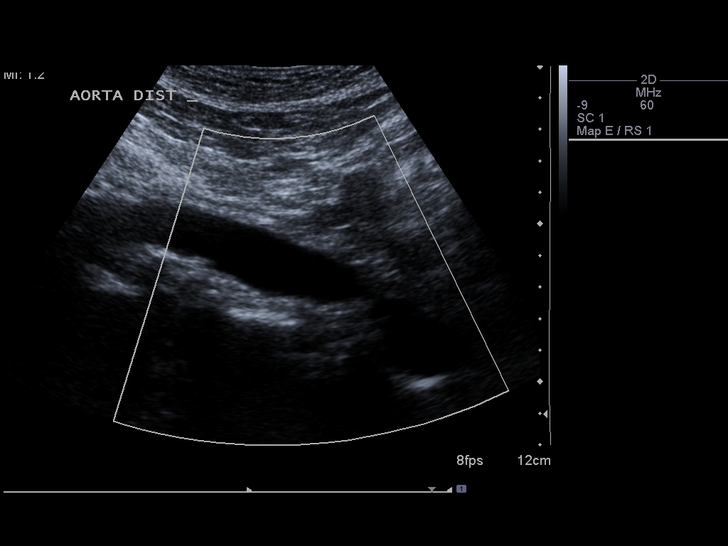
[im 13/17]
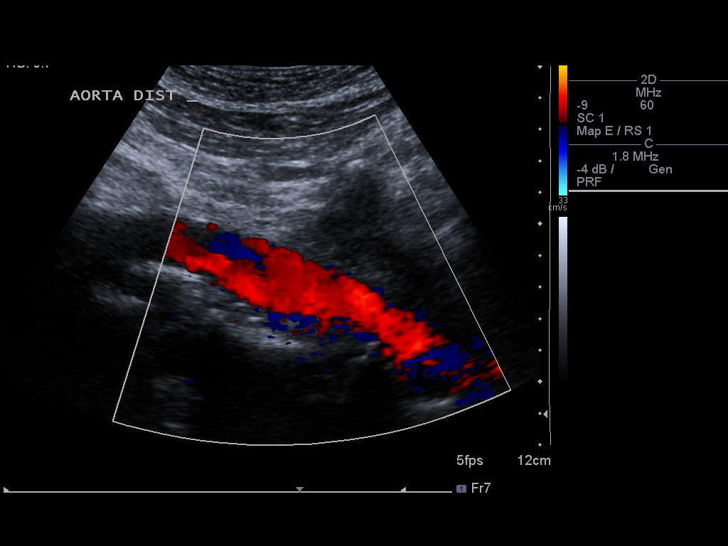
[im 14/17]
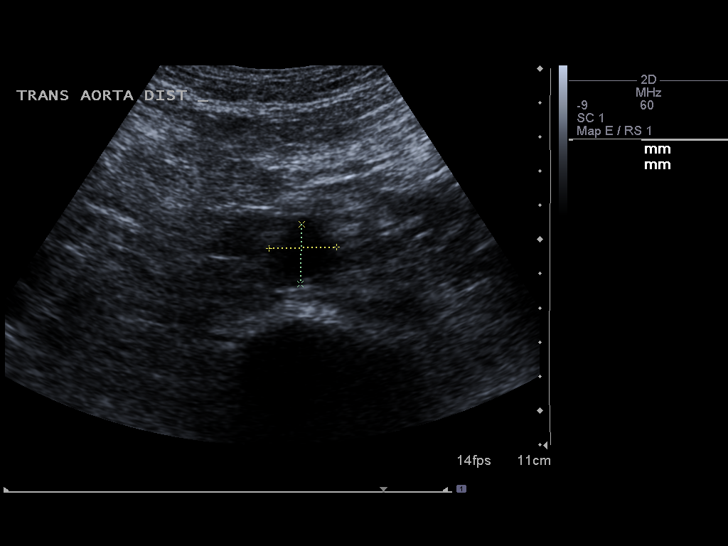
[im 16/17]
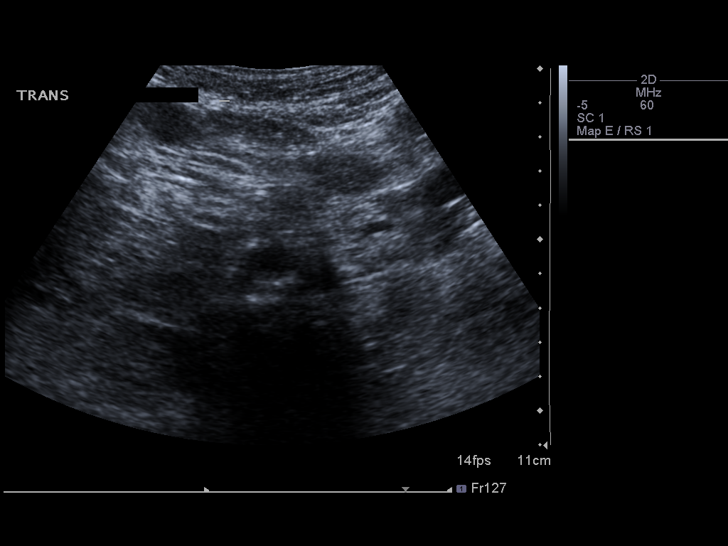
[im 17/17]
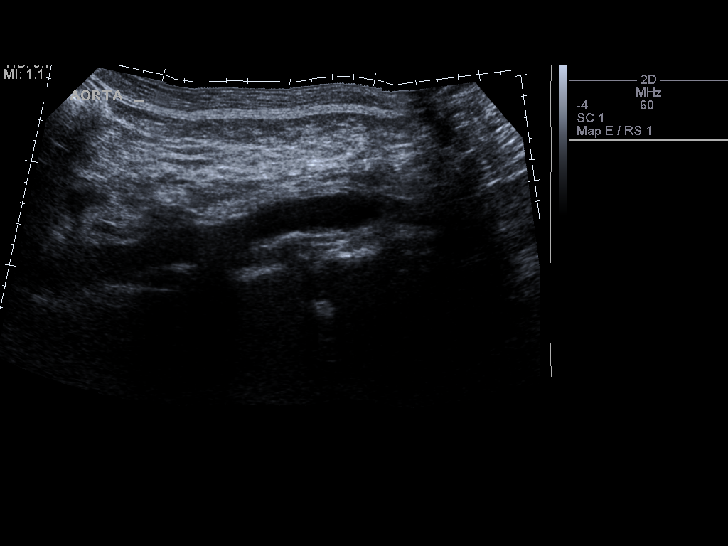

[14 of 17 positions shown; findings below may reference images not displayed]

Abdominal Aorta:  No abdominal aortic aneurysm is seen.
Atheromatous change of the abdominal aorta is noted.

      Maximum AP diameter:  2.4 cm.
      Maximum TRV diameter:  2.2 cm.
IMPRESSION: No abdominal aortic aneurysm.  Atheromatous change is noted
throughout the abdominal aorta.

## 2012-12-28 ENCOUNTER — Other Ambulatory Visit: Payer: Self-pay | Admitting: Internal Medicine

## 2013-01-19 ENCOUNTER — Telehealth: Payer: Self-pay | Admitting: *Deleted

## 2013-01-19 NOTE — Telephone Encounter (Signed)
Pt called when asked to confirm date of birth became rude.  Pt wants to know which of his medications is for joint pain and which is for heart rate.  Pt wants only his provider to return his call.

## 2013-01-19 NOTE — Telephone Encounter (Signed)
Spoke with pt advised of Regina's message. 

## 2013-01-19 NOTE — Telephone Encounter (Signed)
There is nothing on his medication list for joint pain, and Toprol is for heart rate. If he continues to be rude with staff he will be discharged from the practice.

## 2013-02-03 DIAGNOSIS — H40019 Open angle with borderline findings, low risk, unspecified eye: Secondary | ICD-10-CM | POA: Diagnosis not present

## 2013-02-03 DIAGNOSIS — H2589 Other age-related cataract: Secondary | ICD-10-CM | POA: Diagnosis not present

## 2013-02-13 DIAGNOSIS — C61 Malignant neoplasm of prostate: Secondary | ICD-10-CM | POA: Diagnosis not present

## 2013-02-21 DIAGNOSIS — C61 Malignant neoplasm of prostate: Secondary | ICD-10-CM | POA: Diagnosis not present

## 2013-02-21 DIAGNOSIS — N401 Enlarged prostate with lower urinary tract symptoms: Secondary | ICD-10-CM | POA: Diagnosis not present

## 2013-03-13 ENCOUNTER — Telehealth: Payer: Self-pay | Admitting: Radiation Oncology

## 2013-03-13 NOTE — Telephone Encounter (Signed)
Patient phoned today questioning if he should arrange a follow up appointment with Dr. Kathrynn Running. Explained that per Dr. Broadus John note he only needed to follow up with his urologist. Explained our staff is always available for future needs. He verbalized understanding and asked this writer tell Dr. Kathrynn Running, "Hello!"

## 2013-03-28 ENCOUNTER — Ambulatory Visit: Payer: Medicare Other | Admitting: Internal Medicine

## 2013-03-28 ENCOUNTER — Ambulatory Visit (INDEPENDENT_AMBULATORY_CARE_PROVIDER_SITE_OTHER): Payer: Medicare Other | Admitting: Internal Medicine

## 2013-03-28 ENCOUNTER — Encounter: Payer: Self-pay | Admitting: Internal Medicine

## 2013-03-28 VITALS — BP 120/72 | HR 89 | Temp 97.8°F | Ht 72.0 in | Wt 220.0 lb

## 2013-03-28 DIAGNOSIS — Z23 Encounter for immunization: Secondary | ICD-10-CM | POA: Diagnosis not present

## 2013-03-28 DIAGNOSIS — I10 Essential (primary) hypertension: Secondary | ICD-10-CM

## 2013-03-28 DIAGNOSIS — E785 Hyperlipidemia, unspecified: Secondary | ICD-10-CM

## 2013-03-28 DIAGNOSIS — D509 Iron deficiency anemia, unspecified: Secondary | ICD-10-CM | POA: Diagnosis not present

## 2013-03-28 DIAGNOSIS — E1159 Type 2 diabetes mellitus with other circulatory complications: Secondary | ICD-10-CM

## 2013-03-28 DIAGNOSIS — D6489 Other specified anemias: Secondary | ICD-10-CM

## 2013-03-28 DIAGNOSIS — IMO0001 Reserved for inherently not codable concepts without codable children: Secondary | ICD-10-CM

## 2013-03-28 DIAGNOSIS — E538 Deficiency of other specified B group vitamins: Secondary | ICD-10-CM | POA: Diagnosis not present

## 2013-03-28 DIAGNOSIS — E1169 Type 2 diabetes mellitus with other specified complication: Secondary | ICD-10-CM

## 2013-03-28 DIAGNOSIS — E119 Type 2 diabetes mellitus without complications: Secondary | ICD-10-CM

## 2013-03-28 DIAGNOSIS — Z0000 Encounter for general adult medical examination without abnormal findings: Secondary | ICD-10-CM | POA: Insufficient documentation

## 2013-03-28 NOTE — Progress Notes (Signed)
Pre-visit discussion using our clinic review tool. No additional management support is needed unless otherwise documented below in the visit note.  

## 2013-03-28 NOTE — Assessment & Plan Note (Signed)
stable overall by history and exam, recent data reviewed with pt, and pt to continue medical treatment as before,  to f/u any worsening symptoms or concerns BP Readings from Last 3 Encounters:  03/28/13 120/72  12/12/12 150/82  11/24/12 122/68

## 2013-03-28 NOTE — Assessment & Plan Note (Signed)
stable overall by history and exam, recent data reviewed with pt, and pt to continue medical treatment as before,  to f/u any worsening symptoms or concerns Lab Results  Component Value Date   HGBA1C 6.7* 11/24/2012

## 2013-03-28 NOTE — Progress Notes (Signed)
Subjective:    Patient ID: Adrian Bass, male    DOB: June 18, 1940, 72 y.o.   MRN: [id number]  HPI  Here to f/u; overall doing ok,  Pt denies chest pain, increased sob or doe, wheezing, orthopnea, PND, increased LE swelling, palpitations, dizziness or syncope.  Pt denies polydipsia, polyuria, or low sugar symptoms such as weakness or confusion improved with po intake.  Pt denies new neurological symptoms such as new headache, or facial or extremity weakness or numbness.   Pt states overall good compliance with meds, has been trying to follow lower cholesterol, diabetic diet, with wt overall stable,  but little exercise however. No overt bleeding or bruising. No prior hx of anemia per pt. Declines flu shot.  Tolerating the fenofibrate for elev TG  - July 2014 Past Medical History  Diagnosis Date  . Hypertension   . Hyperlipidemia   . Adenocarcinoma   . Prostate cancer 04/25/12    Gleason 4+3=7,&4+5=9,PSA=70.71,Volume=104.6cc  . BPH with obstruction/lower urinary tract symptoms   . Diverticulosis     hx  . Diabetes mellitus     Type II   Past Surgical History  Procedure Laterality Date  . Prostate biopsy  04/25/12    Adenocarcinoma  . Tonsillectomy      reports that he has never smoked. He has never used smokeless tobacco. He reports that he drinks about 0.6 ounces of alcohol per week. He reports that he does not use illicit drugs. family history includes Heart disease in his father and mother. There is no history of Cancer, Depression, or Stroke. No Known Allergies Current Outpatient Prescriptions on File Prior to Visit  Medication Sig Dispense Refill  . atorvastatin (LIPITOR) 40 MG tablet TAKE ONE TABLET BY MOUTH ONE TIME DAILY  30 tablet  11  . Choline Fenofibrate 135 MG capsule Take 1 capsule (135 mg total) by mouth daily.  30 capsule  3  . glipiZIDE (GLUCOTROL) 10 MG tablet Take 1 tablet (10 mg total) by mouth 2 (two) times daily before a meal.  60 tablet  3  . metFORMIN  (GLUCOPHAGE) 850 MG tablet TAKE ONE TABLET BY MOUTH TWICE DAILY   60 tablet  2  . metoprolol succinate (TOPROL-XL) 25 MG 24 hr tablet Take 1 tablet (25 mg total) by mouth daily.  30 tablet  5  . olmesartan-hydrochlorothiazide (BENICAR HCT) 40-25 MG per tablet Take 1 tablet by mouth daily.  30 tablet  11  . vardenafil (LEVITRA) 20 MG tablet Take 1 tablet (20 mg total) by mouth as needed for erectile dysfunction.  40 tablet  5   No current facility-administered medications on file prior to visit.    Review of Systems  Constitutional: Negative for unexpected weight change, or unusual diaphoresis  HENT: Negative for tinnitus.   Eyes: Negative for photophobia and visual disturbance.  Respiratory: Negative for choking and stridor.   Gastrointestinal: Negative for vomiting and blood in stool.  Genitourinary: Negative for hematuria and decreased urine volume.  Musculoskeletal: Negative for acute joint swelling Skin: Negative for color change and wound.  Neurological: Negative for tremors and numbness other than noted  Psychiatric/Behavioral: Negative for decreased concentration or  hyperactivity.       Objective:   Physical Exam BP 120/72  Pulse 89  Temp(Src) 97.8 F (36.6 C) (Oral)  Ht 6' (1.829 m)  Wt 220 lb (99.791 kg)  BMI 29.83 kg/m2  SpO2 96% VS noted, not ill appearing Constitutional: Pt appears well-developed and well-nourished.  HENT:  Head: NCAT.  Right Ear: External ear normal.  Left Ear: External ear normal.  Eyes: Conjunctivae and EOM are normal. Pupils are equal, round, and reactive to light.  Neck: Normal range of motion. Neck supple.  Cardiovascular: Normal rate and regular rhythm.   Pulmonary/Chest: Effort normal and breath sounds normal.  Abd:  Soft, NT, non-distended, + BS Neurological: Pt is alert. Not confused  Skin: Skin is warm. No erythema.  Psychiatric: Pt behavior is normal. Thought content normal.     Assessment & Plan:

## 2013-03-28 NOTE — Assessment & Plan Note (Signed)
New onset vs spurious value, for f/u labs, iron panel

## 2013-03-28 NOTE — Patient Instructions (Addendum)
You had the new Prevnar pneumonia shot Please continue all other medications as before Please have the pharmacy call with any other refills you may need.  Please go to the LAB in the Basement (turn left off the elevator) for the tests to be done today You will be contacted by phone if any changes need to be made immediately.  Otherwise, you will receive a letter about your results with an explanation, but please check with MyChart first.  Please return in 6 months, or sooner if needed

## 2013-03-28 NOTE — Assessment & Plan Note (Signed)
With elev TG, for f/u lipids,  to f/u any worsening symptoms or concerns Lab Results  Component Value Date   LDLCALC 65 07/20/2011   Lab Results  Component Value Date   CHOL 266* 11/24/2012   HDL 40.10 11/24/2012   LDLCALC 65 07/20/2011   LDLDIRECT 98.2 11/24/2012   TRIG 1043.0 Triglyceride is over 400; calculations on Lipids are invalid.* 11/24/2012   CHOLHDL 7 11/24/2012

## 2013-03-29 ENCOUNTER — Encounter: Payer: Self-pay | Admitting: Internal Medicine

## 2013-03-29 ENCOUNTER — Other Ambulatory Visit (INDEPENDENT_AMBULATORY_CARE_PROVIDER_SITE_OTHER): Payer: Medicare Other

## 2013-03-29 DIAGNOSIS — D509 Iron deficiency anemia, unspecified: Secondary | ICD-10-CM | POA: Diagnosis not present

## 2013-03-29 DIAGNOSIS — D6489 Other specified anemias: Secondary | ICD-10-CM | POA: Diagnosis not present

## 2013-03-29 LAB — BASIC METABOLIC PANEL
CO2: 26 mEq/L (ref 19–32)
Calcium: 9.7 mg/dL (ref 8.4–10.5)
Chloride: 101 mEq/L (ref 96–112)
Glucose, Bld: 185 mg/dL — ABNORMAL HIGH (ref 70–99)
Potassium: 3.9 mEq/L (ref 3.5–5.1)
Sodium: 136 mEq/L (ref 135–145)

## 2013-03-29 LAB — CBC WITH DIFFERENTIAL/PLATELET
Basophils Absolute: 0 10*3/uL (ref 0.0–0.1)
Basophils Relative: 0.3 % (ref 0.0–3.0)
Eosinophils Relative: 1.8 % (ref 0.0–5.0)
HCT: 32.9 % — ABNORMAL LOW (ref 39.0–52.0)
Lymphocytes Relative: 10.8 % — ABNORMAL LOW (ref 12.0–46.0)
Lymphs Abs: 0.4 10*3/uL — ABNORMAL LOW (ref 0.7–4.0)
MCV: 87.6 fl (ref 78.0–100.0)
Monocytes Absolute: 0.4 10*3/uL (ref 0.1–1.0)
Monocytes Relative: 8.7 % (ref 3.0–12.0)
Neutrophils Relative %: 78.4 % — ABNORMAL HIGH (ref 43.0–77.0)
Platelets: 219 10*3/uL (ref 150.0–400.0)
RDW: 14.4 % (ref 11.5–14.6)

## 2013-03-29 LAB — LIPID PANEL
Total CHOL/HDL Ratio: 6
Triglycerides: 355 mg/dL — ABNORMAL HIGH (ref 0.0–149.0)

## 2013-03-29 LAB — HEPATIC FUNCTION PANEL
ALT: 26 U/L (ref 0–53)
AST: 32 U/L (ref 0–37)
Albumin: 4.2 g/dL (ref 3.5–5.2)
Alkaline Phosphatase: 40 U/L (ref 39–117)
Bilirubin, Direct: 0.1 mg/dL (ref 0.0–0.3)
Total Bilirubin: 0.8 mg/dL (ref 0.3–1.2)
Total Protein: 6.7 g/dL (ref 6.0–8.3)

## 2013-03-29 LAB — IBC PANEL
Iron: 58 ug/dL (ref 42–165)
Transferrin: 337.2 mg/dL (ref 212.0–360.0)

## 2013-03-29 LAB — LDL CHOLESTEROL, DIRECT: Direct LDL: 125.3 mg/dL

## 2013-04-03 ENCOUNTER — Other Ambulatory Visit: Payer: Self-pay | Admitting: Internal Medicine

## 2013-04-03 ENCOUNTER — Other Ambulatory Visit: Payer: Self-pay | Admitting: *Deleted

## 2013-04-03 DIAGNOSIS — E781 Pure hyperglyceridemia: Secondary | ICD-10-CM

## 2013-04-04 MED ORDER — CHOLINE FENOFIBRATE 135 MG PO CPDR: 135.0000 mg | DELAYED_RELEASE_CAPSULE | Freq: Every day | ORAL | Status: DC

## 2013-04-20 DIAGNOSIS — S239XXA Sprain of unspecified parts of thorax, initial encounter: Secondary | ICD-10-CM | POA: Diagnosis not present

## 2013-04-20 DIAGNOSIS — M47817 Spondylosis without myelopathy or radiculopathy, lumbosacral region: Secondary | ICD-10-CM | POA: Diagnosis not present

## 2013-04-20 DIAGNOSIS — M9981 Other biomechanical lesions of cervical region: Secondary | ICD-10-CM | POA: Diagnosis not present

## 2013-04-20 DIAGNOSIS — M999 Biomechanical lesion, unspecified: Secondary | ICD-10-CM | POA: Diagnosis not present

## 2013-04-20 DIAGNOSIS — M5030 Other cervical disc degeneration, unspecified cervical region: Secondary | ICD-10-CM | POA: Diagnosis not present

## 2013-05-02 ENCOUNTER — Other Ambulatory Visit: Payer: Self-pay | Admitting: Internal Medicine

## 2013-05-02 ENCOUNTER — Other Ambulatory Visit: Payer: Self-pay | Admitting: Family Medicine

## 2013-05-03 MED ORDER — GLIPIZIDE 10 MG PO TABS: 10.0000 mg | ORAL_TABLET | Freq: Two times a day (BID) | ORAL | Status: DC

## 2013-05-03 NOTE — Telephone Encounter (Signed)
Request for Glipizide last filled 11/14---please advise

## 2013-05-03 NOTE — Telephone Encounter (Signed)
Is this okay to refill? 

## 2013-05-03 NOTE — Addendum Note (Signed)
Addended by: Roena Malady on: 05/03/2013 03:02 PM   Modules accepted: Orders

## 2013-05-04 DIAGNOSIS — M999 Biomechanical lesion, unspecified: Secondary | ICD-10-CM | POA: Diagnosis not present

## 2013-05-04 DIAGNOSIS — S239XXA Sprain of unspecified parts of thorax, initial encounter: Secondary | ICD-10-CM | POA: Diagnosis not present

## 2013-05-04 DIAGNOSIS — M5030 Other cervical disc degeneration, unspecified cervical region: Secondary | ICD-10-CM | POA: Diagnosis not present

## 2013-05-04 DIAGNOSIS — M47817 Spondylosis without myelopathy or radiculopathy, lumbosacral region: Secondary | ICD-10-CM | POA: Diagnosis not present

## 2013-05-04 DIAGNOSIS — M9981 Other biomechanical lesions of cervical region: Secondary | ICD-10-CM | POA: Diagnosis not present

## 2013-05-05 ENCOUNTER — Other Ambulatory Visit: Payer: Self-pay | Admitting: *Deleted

## 2013-05-05 MED ORDER — SERTRALINE HCL 50 MG PO TABS: 50.0000 mg | ORAL_TABLET | Freq: Every day | ORAL | Status: DC

## 2013-05-05 NOTE — Telephone Encounter (Signed)
Received request from pharmacy for Zoloft 50 mg with directions to take 1/2 tab qd. The most recent rx was written on 11/29/12 for Zoloft 50 mg 1 tab qd # 30 with 0 refills.

## 2013-05-05 NOTE — Telephone Encounter (Signed)
i have it as 1 tab qd. This is how he should be taking it

## 2013-05-22 DIAGNOSIS — H353290 Exudative age-related macular degeneration, unspecified eye, stage unspecified: Secondary | ICD-10-CM | POA: Diagnosis not present

## 2013-05-22 DIAGNOSIS — H35029 Exudative retinopathy, unspecified eye: Secondary | ICD-10-CM | POA: Diagnosis not present

## 2013-05-22 DIAGNOSIS — H353190 Nonexudative age-related macular degeneration, unspecified eye, stage unspecified: Secondary | ICD-10-CM | POA: Diagnosis not present

## 2013-05-22 DIAGNOSIS — H43819 Vitreous degeneration, unspecified eye: Secondary | ICD-10-CM | POA: Diagnosis not present

## 2013-05-22 DIAGNOSIS — H2589 Other age-related cataract: Secondary | ICD-10-CM | POA: Diagnosis not present

## 2013-05-22 DIAGNOSIS — H3560 Retinal hemorrhage, unspecified eye: Secondary | ICD-10-CM | POA: Diagnosis not present

## 2013-05-22 DIAGNOSIS — H4420 Degenerative myopia, unspecified eye: Secondary | ICD-10-CM | POA: Diagnosis not present

## 2013-05-22 DIAGNOSIS — H31099 Other chorioretinal scars, unspecified eye: Secondary | ICD-10-CM | POA: Diagnosis not present

## 2013-05-22 DIAGNOSIS — H40019 Open angle with borderline findings, low risk, unspecified eye: Secondary | ICD-10-CM | POA: Diagnosis not present

## 2013-05-25 DIAGNOSIS — M47817 Spondylosis without myelopathy or radiculopathy, lumbosacral region: Secondary | ICD-10-CM | POA: Diagnosis not present

## 2013-05-25 DIAGNOSIS — M5030 Other cervical disc degeneration, unspecified cervical region: Secondary | ICD-10-CM | POA: Diagnosis not present

## 2013-05-25 DIAGNOSIS — S239XXA Sprain of unspecified parts of thorax, initial encounter: Secondary | ICD-10-CM | POA: Diagnosis not present

## 2013-05-25 DIAGNOSIS — M999 Biomechanical lesion, unspecified: Secondary | ICD-10-CM | POA: Diagnosis not present

## 2013-05-25 DIAGNOSIS — M9981 Other biomechanical lesions of cervical region: Secondary | ICD-10-CM | POA: Diagnosis not present

## 2013-05-31 DIAGNOSIS — H35059 Retinal neovascularization, unspecified, unspecified eye: Secondary | ICD-10-CM | POA: Diagnosis not present

## 2013-05-31 DIAGNOSIS — H353290 Exudative age-related macular degeneration, unspecified eye, stage unspecified: Secondary | ICD-10-CM | POA: Diagnosis not present

## 2013-06-13 DIAGNOSIS — H353190 Nonexudative age-related macular degeneration, unspecified eye, stage unspecified: Secondary | ICD-10-CM | POA: Diagnosis not present

## 2013-06-15 DIAGNOSIS — M47817 Spondylosis without myelopathy or radiculopathy, lumbosacral region: Secondary | ICD-10-CM | POA: Diagnosis not present

## 2013-06-15 DIAGNOSIS — S239XXA Sprain of unspecified parts of thorax, initial encounter: Secondary | ICD-10-CM | POA: Diagnosis not present

## 2013-06-15 DIAGNOSIS — M9981 Other biomechanical lesions of cervical region: Secondary | ICD-10-CM | POA: Diagnosis not present

## 2013-06-15 DIAGNOSIS — M999 Biomechanical lesion, unspecified: Secondary | ICD-10-CM | POA: Diagnosis not present

## 2013-06-15 DIAGNOSIS — M5030 Other cervical disc degeneration, unspecified cervical region: Secondary | ICD-10-CM | POA: Diagnosis not present

## 2013-06-19 DIAGNOSIS — C61 Malignant neoplasm of prostate: Secondary | ICD-10-CM | POA: Diagnosis not present

## 2013-06-28 DIAGNOSIS — M949 Disorder of cartilage, unspecified: Secondary | ICD-10-CM | POA: Diagnosis not present

## 2013-06-28 DIAGNOSIS — N401 Enlarged prostate with lower urinary tract symptoms: Secondary | ICD-10-CM | POA: Diagnosis not present

## 2013-06-28 DIAGNOSIS — C61 Malignant neoplasm of prostate: Secondary | ICD-10-CM | POA: Diagnosis not present

## 2013-06-28 DIAGNOSIS — M899 Disorder of bone, unspecified: Secondary | ICD-10-CM | POA: Diagnosis not present

## 2013-06-28 DIAGNOSIS — N139 Obstructive and reflux uropathy, unspecified: Secondary | ICD-10-CM | POA: Diagnosis not present

## 2013-07-05 DIAGNOSIS — H353290 Exudative age-related macular degeneration, unspecified eye, stage unspecified: Secondary | ICD-10-CM | POA: Diagnosis not present

## 2013-07-05 DIAGNOSIS — H35059 Retinal neovascularization, unspecified, unspecified eye: Secondary | ICD-10-CM | POA: Diagnosis not present

## 2013-07-11 ENCOUNTER — Other Ambulatory Visit: Payer: Self-pay | Admitting: Internal Medicine

## 2013-07-12 ENCOUNTER — Other Ambulatory Visit: Payer: Self-pay

## 2013-07-12 MED ORDER — METFORMIN HCL 850 MG PO TABS: 850.0000 mg | ORAL_TABLET | Freq: Two times a day (BID) | ORAL | Status: DC

## 2013-07-14 ENCOUNTER — Telehealth: Payer: Self-pay | Admitting: *Deleted

## 2013-07-14 MED ORDER — SERTRALINE HCL 50 MG PO TABS: 50.0000 mg | ORAL_TABLET | Freq: Every day | ORAL | Status: DC

## 2013-07-14 NOTE — Telephone Encounter (Signed)
Patient phoned needing his PCP changed in system to Adrian Bass (completed) and refills on his glucophage and sertraline (Robin already sent in glucophage).  Please advise regarding Zoloft (last OV with PCP 03/28/13.  CB# [phone]

## 2013-07-18 ENCOUNTER — Ambulatory Visit: Payer: Medicare Other | Admitting: Internal Medicine

## 2013-07-18 DIAGNOSIS — Z0289 Encounter for other administrative examinations: Secondary | ICD-10-CM

## 2013-08-09 ENCOUNTER — Other Ambulatory Visit: Payer: Self-pay | Admitting: Internal Medicine

## 2013-08-09 DIAGNOSIS — H353290 Exudative age-related macular degeneration, unspecified eye, stage unspecified: Secondary | ICD-10-CM | POA: Diagnosis not present

## 2013-08-09 DIAGNOSIS — H35059 Retinal neovascularization, unspecified, unspecified eye: Secondary | ICD-10-CM | POA: Diagnosis not present

## 2013-08-10 NOTE — Telephone Encounter (Signed)
Done erx 

## 2013-08-22 ENCOUNTER — Ambulatory Visit (INDEPENDENT_AMBULATORY_CARE_PROVIDER_SITE_OTHER): Payer: Medicare Other | Admitting: Internal Medicine

## 2013-08-22 ENCOUNTER — Other Ambulatory Visit: Payer: Self-pay

## 2013-08-22 VITALS — BP 132/70 | HR 80 | Temp 97.0°F | Wt 222.5 lb

## 2013-08-22 DIAGNOSIS — E1159 Type 2 diabetes mellitus with other circulatory complications: Secondary | ICD-10-CM

## 2013-08-22 DIAGNOSIS — E1169 Type 2 diabetes mellitus with other specified complication: Secondary | ICD-10-CM | POA: Diagnosis not present

## 2013-08-22 DIAGNOSIS — E119 Type 2 diabetes mellitus without complications: Secondary | ICD-10-CM | POA: Diagnosis not present

## 2013-08-22 DIAGNOSIS — E785 Hyperlipidemia, unspecified: Secondary | ICD-10-CM

## 2013-08-22 DIAGNOSIS — I10 Essential (primary) hypertension: Secondary | ICD-10-CM | POA: Diagnosis not present

## 2013-08-22 MED ORDER — METFORMIN HCL 850 MG PO TABS: 850.0000 mg | ORAL_TABLET | Freq: Two times a day (BID) | ORAL | Status: DC

## 2013-08-22 MED ORDER — SERTRALINE HCL 50 MG PO TABS: 50.0000 mg | ORAL_TABLET | Freq: Every day | ORAL | Status: DC

## 2013-08-22 NOTE — Progress Notes (Signed)
Pre visit review using our clinic review tool, if applicable. No additional management support is needed unless otherwise documented below in the visit note. 

## 2013-08-22 NOTE — Progress Notes (Signed)
Subjective:    Patient ID: Adrian Bass, male    DOB: Nov 29, 1940, 73 y.o.   MRN: [id number]  HPI  Here to f/u; overall doing ok,  Pt denies chest pain, increased sob or doe, wheezing, orthopnea, PND, increased LE swelling, palpitations, dizziness or syncope.  Pt denies polydipsia, polyuria, or low sugar symptoms such as weakness or confusion improved with po intake.  Pt denies new neurological symptoms such as new headache, or facial or extremity weakness or numbness.   Pt states overall good compliance with meds, has been trying to follow lower cholesterol, diabetic diet, with wt overall stable. Goes to gym about 1 hr per day, plans to try to lose wt.   Past Medical History  Diagnosis Date  . Hypertension   . Hyperlipidemia   . Adenocarcinoma   . Prostate cancer 04/25/12    Gleason 4+3=7,&4+5=9,PSA=70.71,Volume=104.6cc  . BPH with obstruction/lower urinary tract symptoms   . Diverticulosis     hx  . Diabetes mellitus     Type II   Past Surgical History  Procedure Laterality Date  . Prostate biopsy  04/25/12    Adenocarcinoma  . Tonsillectomy      reports that he has never smoked. He has never used smokeless tobacco. He reports that he drinks about .6 ounces of alcohol per week. He reports that he does not use illicit drugs. family history includes Heart disease in his father and mother. There is no history of Cancer, Depression, or Stroke. No Known Allergies Current Outpatient Prescriptions on File Prior to Visit  Medication Sig Dispense Refill  . atorvastatin (LIPITOR) 40 MG tablet TAKE ONE TABLET BY MOUTH ONE TIME DAILY  30 tablet  11  . Choline Fenofibrate 135 MG capsule Take 1 capsule (135 mg total) by mouth daily.  30 capsule  3  . glipiZIDE (GLUCOTROL) 10 MG tablet Take 1 tablet (10 mg total) by mouth 2 (two) times daily before a meal.  60 tablet  2  . metoprolol succinate (TOPROL-XL) 25 MG 24 hr tablet TAKE ONE TABLET BY MOUTH ONE TIME DAILY   90 tablet  2  .  olmesartan-hydrochlorothiazide (BENICAR HCT) 40-25 MG per tablet Take 1 tablet by mouth daily.  30 tablet  11  . vardenafil (LEVITRA) 20 MG tablet Take 1 tablet (20 mg total) by mouth as needed for erectile dysfunction.  40 tablet  5   No current facility-administered medications on file prior to visit.    Review of Systems  Constitutional: Negative for unexpected weight change, or unusual diaphoresis  HENT: Negative for tinnitus.   Eyes: Negative for photophobia and visual disturbance.  Respiratory: Negative for choking and stridor.   Gastrointestinal: Negative for vomiting and blood in stool.  Genitourinary: Negative for hematuria and decreased urine volume.  Musculoskeletal: Negative for acute joint swelling Skin: Negative for color change and wound.  Neurological: Negative for tremors and numbness other than noted  Psychiatric/Behavioral: Negative for decreased concentration or  hyperactivity.       Objective:   Physical Exam BP 132/70  Pulse 80  Temp(Src) 97 F (36.1 C) (Oral)  Wt 222 lb 8 oz (100.925 kg)  SpO2 97% VS noted,  Constitutional: Pt appears well-developed and well-nourished.  HENT: Head: NCAT.  Right Ear: External ear normal.  Left Ear: External ear normal.  Eyes: Conjunctivae and EOM are normal. Pupils are equal, round, and reactive to light.  Neck: Normal range of motion. Neck supple.  Cardiovascular: Normal rate and regular  rhythm.   Pulmonary/Chest: Effort normal and breath sounds normal.  Neurological: Pt is alert. Not confused  Skin: Skin is warm. No erythema.  Psychiatric: Pt behavior is normal. Thought content normal.     Assessment & Plan:

## 2013-08-22 NOTE — Patient Instructions (Signed)
Please continue all other medications as before, and refills have been done if requested. Please have the pharmacy call with any other refills you may need.  Please continue your efforts at being more active, low cholesterol diet, and weight control.  Please return in 6 months, or sooner if needed 

## 2013-08-23 ENCOUNTER — Telehealth: Payer: Self-pay | Admitting: Internal Medicine

## 2013-08-23 NOTE — Telephone Encounter (Signed)
Relevant patient education mailed to patient.  

## 2013-08-24 DIAGNOSIS — H353190 Nonexudative age-related macular degeneration, unspecified eye, stage unspecified: Secondary | ICD-10-CM | POA: Diagnosis not present

## 2013-08-24 DIAGNOSIS — H35029 Exudative retinopathy, unspecified eye: Secondary | ICD-10-CM | POA: Diagnosis not present

## 2013-08-27 NOTE — Assessment & Plan Note (Signed)
stable overall by history and exam, recent data reviewed with pt, and pt to continue medical treatment as before,  to f/u any worsening symptoms or concerns Lab Results  Component Value Date   LDLCALC 65 07/20/2011    

## 2013-08-27 NOTE — Assessment & Plan Note (Signed)
stable overall by history and exam, recent data reviewed with pt, and pt to continue medical treatment as before,  to f/u any worsening symptoms or concerns Lab Results  Component Value Date   HGBA1C 6.6* 03/29/2013

## 2013-08-27 NOTE — Assessment & Plan Note (Signed)
stable overall by history and exam, recent data reviewed with pt, and pt to continue medical treatment as before,  to f/u any worsening symptoms or concerns BP Readings from Last 3 Encounters:  08/22/13 132/70  03/28/13 120/72  12/12/12 150/82

## 2013-09-08 ENCOUNTER — Telehealth: Payer: Self-pay

## 2013-09-08 NOTE — Telephone Encounter (Signed)
Relevant patient education mailed to patient.  

## 2013-09-20 DIAGNOSIS — H35059 Retinal neovascularization, unspecified, unspecified eye: Secondary | ICD-10-CM | POA: Diagnosis not present

## 2013-09-20 DIAGNOSIS — H353290 Exudative age-related macular degeneration, unspecified eye, stage unspecified: Secondary | ICD-10-CM | POA: Diagnosis not present

## 2013-10-25 DIAGNOSIS — H353290 Exudative age-related macular degeneration, unspecified eye, stage unspecified: Secondary | ICD-10-CM | POA: Diagnosis not present

## 2013-10-25 DIAGNOSIS — H35059 Retinal neovascularization, unspecified, unspecified eye: Secondary | ICD-10-CM | POA: Diagnosis not present

## 2013-10-25 DIAGNOSIS — C61 Malignant neoplasm of prostate: Secondary | ICD-10-CM | POA: Diagnosis not present

## 2013-10-27 ENCOUNTER — Other Ambulatory Visit: Payer: Self-pay

## 2013-10-27 DIAGNOSIS — E781 Pure hyperglyceridemia: Secondary | ICD-10-CM

## 2013-10-27 MED ORDER — CHOLINE FENOFIBRATE 135 MG PO CPDR: 135.0000 mg | DELAYED_RELEASE_CAPSULE | Freq: Every day | ORAL | Status: DC

## 2013-11-01 DIAGNOSIS — C61 Malignant neoplasm of prostate: Secondary | ICD-10-CM | POA: Diagnosis not present

## 2013-11-01 DIAGNOSIS — N139 Obstructive and reflux uropathy, unspecified: Secondary | ICD-10-CM | POA: Diagnosis not present

## 2013-11-01 DIAGNOSIS — N401 Enlarged prostate with lower urinary tract symptoms: Secondary | ICD-10-CM | POA: Diagnosis not present

## 2013-11-08 DIAGNOSIS — H1045 Other chronic allergic conjunctivitis: Secondary | ICD-10-CM | POA: Diagnosis not present

## 2013-11-08 DIAGNOSIS — H353190 Nonexudative age-related macular degeneration, unspecified eye, stage unspecified: Secondary | ICD-10-CM | POA: Diagnosis not present

## 2013-12-05 DIAGNOSIS — H353290 Exudative age-related macular degeneration, unspecified eye, stage unspecified: Secondary | ICD-10-CM | POA: Diagnosis not present

## 2013-12-05 DIAGNOSIS — H35059 Retinal neovascularization, unspecified, unspecified eye: Secondary | ICD-10-CM | POA: Diagnosis not present

## 2014-01-02 ENCOUNTER — Other Ambulatory Visit: Payer: Self-pay | Admitting: Internal Medicine

## 2014-01-24 DIAGNOSIS — H35059 Retinal neovascularization, unspecified, unspecified eye: Secondary | ICD-10-CM | POA: Diagnosis not present

## 2014-01-24 DIAGNOSIS — H353290 Exudative age-related macular degeneration, unspecified eye, stage unspecified: Secondary | ICD-10-CM | POA: Diagnosis not present

## 2014-02-20 ENCOUNTER — Encounter: Payer: Self-pay | Admitting: Internal Medicine

## 2014-02-20 ENCOUNTER — Ambulatory Visit (INDEPENDENT_AMBULATORY_CARE_PROVIDER_SITE_OTHER): Payer: Medicare Other | Admitting: Internal Medicine

## 2014-02-20 ENCOUNTER — Other Ambulatory Visit (INDEPENDENT_AMBULATORY_CARE_PROVIDER_SITE_OTHER): Payer: Medicare Other

## 2014-02-20 VITALS — BP 110/74 | HR 79 | Temp 97.8°F | Wt 218.8 lb

## 2014-02-20 DIAGNOSIS — D6489 Other specified anemias: Secondary | ICD-10-CM

## 2014-02-20 DIAGNOSIS — Z79899 Other long term (current) drug therapy: Secondary | ICD-10-CM

## 2014-02-20 DIAGNOSIS — E119 Type 2 diabetes mellitus without complications: Secondary | ICD-10-CM

## 2014-02-20 DIAGNOSIS — Z23 Encounter for immunization: Secondary | ICD-10-CM

## 2014-02-20 DIAGNOSIS — N320 Bladder-neck obstruction: Secondary | ICD-10-CM | POA: Diagnosis not present

## 2014-02-20 DIAGNOSIS — E785 Hyperlipidemia, unspecified: Secondary | ICD-10-CM

## 2014-02-20 DIAGNOSIS — E1159 Type 2 diabetes mellitus with other circulatory complications: Secondary | ICD-10-CM

## 2014-02-20 DIAGNOSIS — E1169 Type 2 diabetes mellitus with other specified complication: Secondary | ICD-10-CM

## 2014-02-20 DIAGNOSIS — I10 Essential (primary) hypertension: Secondary | ICD-10-CM

## 2014-02-20 DIAGNOSIS — I152 Hypertension secondary to endocrine disorders: Secondary | ICD-10-CM

## 2014-02-20 DIAGNOSIS — D649 Anemia, unspecified: Secondary | ICD-10-CM

## 2014-02-20 HISTORY — DX: Anemia, unspecified: D64.9

## 2014-02-20 LAB — CBC WITH DIFFERENTIAL/PLATELET
Basophils Absolute: 0 10*3/uL (ref 0.0–0.1)
Basophils Relative: 0.3 % (ref 0.0–3.0)
EOS PCT: 1.3 % (ref 0.0–5.0)
Eosinophils Absolute: 0.1 10*3/uL (ref 0.0–0.7)
HEMATOCRIT: 33.1 % — AB (ref 39.0–52.0)
Hemoglobin: 11.4 g/dL — ABNORMAL LOW (ref 13.0–17.0)
LYMPHS ABS: 0.5 10*3/uL — AB (ref 0.7–4.0)
Lymphocytes Relative: 11.4 % — ABNORMAL LOW (ref 12.0–46.0)
MCHC: 34.4 g/dL (ref 30.0–36.0)
MCV: 84.5 fl (ref 78.0–100.0)
MONOS PCT: 10.8 % (ref 3.0–12.0)
Monocytes Absolute: 0.5 10*3/uL (ref 0.1–1.0)
NEUTROS ABS: 3.3 10*3/uL (ref 1.4–7.7)
Neutrophils Relative %: 76.2 % (ref 43.0–77.0)
Platelets: 248 10*3/uL (ref 150.0–400.0)
RBC: 3.91 Mil/uL — ABNORMAL LOW (ref 4.22–5.81)
RDW: 14.9 % (ref 11.5–15.5)
WBC: 4.3 10*3/uL (ref 4.0–10.5)

## 2014-02-20 LAB — URINALYSIS, ROUTINE W REFLEX MICROSCOPIC
BILIRUBIN URINE: NEGATIVE
Hgb urine dipstick: NEGATIVE
Ketones, ur: NEGATIVE
LEUKOCYTES UA: NEGATIVE
NITRITE: NEGATIVE
RBC / HPF: NONE SEEN (ref 0–?)
SPECIFIC GRAVITY, URINE: 1.02 (ref 1.000–1.030)
Total Protein, Urine: NEGATIVE
Urine Glucose: NEGATIVE
Urobilinogen, UA: 0.2 (ref 0.0–1.0)
WBC, UA: NONE SEEN (ref 0–?)
pH: 6 (ref 5.0–8.0)

## 2014-02-20 LAB — MICROALBUMIN / CREATININE URINE RATIO
Creatinine,U: 210.9 mg/dL
Microalb Creat Ratio: 0.8 mg/g (ref 0.0–30.0)
Microalb, Ur: 1.6 mg/dL (ref 0.0–1.9)

## 2014-02-20 LAB — HEMOGLOBIN A1C: Hgb A1c MFr Bld: 6.8 % — ABNORMAL HIGH (ref 4.6–6.5)

## 2014-02-20 NOTE — Patient Instructions (Signed)
You had the new Prevnar pneumonia shot today  Please continue all other medications as before, and refills have been done if requested.  Please have the pharmacy call with any other refills you may need.  Please continue your efforts at being more active, low cholesterol diet, and weight control.  You are otherwise up to date with prevention measures today.  Please keep your appointments with your specialists as you may have planned  Please go to the LAB in the Basement (turn left off the elevator) for the tests to be done today  You will be contacted by phone if any changes need to be made immediately.  Otherwise, you will receive a letter about your results with an explanation, but please check with MyChart first.  Please remember to sign up for MyChart if you have not done so, as this will be important to you in the future with finding out test results, communicating by private email, and scheduling acute appointments online when needed.  Please return in 6 months, or sooner if needed 

## 2014-02-20 NOTE — Progress Notes (Signed)
Pre visit review using our clinic review tool, if applicable. No additional management support is needed unless otherwise documented below in the visit note. 

## 2014-02-20 NOTE — Progress Notes (Signed)
Subjective:    Patient ID: Adrian Bass, male    DOB: [birth year], 73 y.o.   MRN: [id number]  HPI   Here to f/u; overall doing ok,  Pt denies chest pain, increased sob or doe, wheezing, orthopnea, PND, increased LE swelling, palpitations, dizziness or syncope.  Pt denies polydipsia, polyuria, or low sugar symptoms such as weakness or confusion improved with po intake.  Pt denies new neurological symptoms such as new headache, or facial or extremity weakness or numbness.   Pt states overall good compliance with meds, has been trying to follow lower cholesterol, diabetic diet, with wt overall stable.  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   Declines flu shot.  OK for Prevnar.  No current complaints Past Medical History  Diagnosis Date  . Hypertension   . Hyperlipidemia   . Adenocarcinoma   . Prostate cancer 04/25/12    Gleason 4+3=7,&4+5=9,PSA=70.71,Volume=104.6cc  . BPH with obstruction/lower urinary tract symptoms   . Diverticulosis     hx  . Diabetes mellitus     Type II  . Anemia 02/20/2014   Past Surgical History  Procedure Laterality Date  . Prostate biopsy  04/25/12    Adenocarcinoma  . Tonsillectomy      reports that he has never smoked. He has never used smokeless tobacco. He reports that he drinks about .6 ounces of alcohol per week. He reports that he does not use illicit drugs. family history includes Heart disease in his father and mother. There is no history of Cancer, Depression, or Stroke. No Known Allergies Current Outpatient Prescriptions on File Prior to Visit  Medication Sig Dispense Refill  . atorvastatin (LIPITOR) 40 MG tablet TAKE ONE TABLET BY MOUTH ONE TIME DAILY  30 tablet  11  . BENICAR HCT 40-25 MG per tablet TAKE ONE TABLET BY MOUTH ONE TIME DAILY   30 tablet  2  . Choline Fenofibrate 135 MG capsule  Take 1 capsule (135 mg total) by mouth daily.  30 capsule  11  . glipiZIDE (GLUCOTROL) 10 MG tablet Take 1 tablet (10 mg total) by mouth 2 (two) times daily before a meal.  60 tablet  2  . metFORMIN (GLUCOPHAGE) 850 MG tablet Take 1 tablet (850 mg total) by mouth 2 (two) times daily with a meal.  180 tablet  3  . metoprolol succinate (TOPROL-XL) 25 MG 24 hr tablet TAKE ONE TABLET BY MOUTH ONE TIME DAILY   90 tablet  2  . sertraline (ZOLOFT) 50 MG tablet Take 1 tablet (50 mg total) by mouth daily.  90 tablet  3  . vardenafil (LEVITRA) 20 MG tablet Take 1 tablet (20 mg total) by mouth as needed for erectile dysfunction.  40 tablet  5   No current facility-administered medications on file prior to visit.    Review of Systems  Constitutional: Negative for unusual diaphoresis or other sweats  HENT: Negative for ringing in ear Eyes: Negative for double vision or worsening visual disturbance.  Respiratory: Negative for choking and stridor.   Gastrointestinal: Negative for vomiting or other signifcant bowel change Genitourinary: Negative for hematuria or decreased urine volume.  Musculoskeletal: Negative for other MSK pain or swelling Skin: Negative for color change and worsening wound.  Neurological: Negative for tremors and numbness other than noted  Psychiatric/Behavioral: Negative  for decreased concentration or agitation other than above       Objective:   Physical Exam BP 110/74  Pulse 79  Temp(Src) 97.8 F (36.6 C) (Oral)  Wt 218 lb 12 oz (99.224 kg)  SpO2 97% VS noted,  Constitutional: Pt appears well-developed, well-nourished.  HENT: Head: NCAT.  Right Ear: External ear normal.  Left Ear: External ear normal.  Eyes: . Pupils are equal, round, and reactive to light. Conjunctivae and EOM are normal Neck: Normal range of motion. Neck supple.  Cardiovascular: Normal rate and regular rhythm.   Pulmonary/Chest: Effort normal and breath sounds normal.  Abd:  Soft, NT, ND, +  BS Neurological: Pt is alert. Not confused , motor grossly intact Skin: Skin is warm. No rash Psychiatric: Pt behavior is normal. No agitation.     Assessment & Plan:

## 2014-02-21 ENCOUNTER — Encounter: Payer: Self-pay | Admitting: Internal Medicine

## 2014-02-21 LAB — BASIC METABOLIC PANEL
BUN: 34 mg/dL — ABNORMAL HIGH (ref 6–23)
CO2: 25 meq/L (ref 19–32)
Calcium: 10 mg/dL (ref 8.4–10.5)
Chloride: 101 mEq/L (ref 96–112)
Creatinine, Ser: 2.5 mg/dL — ABNORMAL HIGH (ref 0.4–1.5)
GFR: 27.6 mL/min — ABNORMAL LOW (ref >60.00)
Glucose, Bld: 172 mg/dL — ABNORMAL HIGH (ref 70–99)
Potassium: 4.5 mEq/L (ref 3.5–5.1)
SODIUM: 137 meq/L (ref 135–145)

## 2014-02-21 LAB — HEPATIC FUNCTION PANEL
ALK PHOS: 40 U/L (ref 39–117)
ALT: 23 U/L (ref 0–53)
AST: 31 U/L (ref 0–37)
Albumin: 4.4 g/dL (ref 3.5–5.2)
BILIRUBIN TOTAL: 0.6 mg/dL (ref 0.2–1.2)
Bilirubin, Direct: 0.1 mg/dL (ref 0.0–0.3)
Total Protein: 7.1 g/dL (ref 6.0–8.3)

## 2014-02-21 LAB — LIPID PANEL
CHOL/HDL RATIO: 6
Cholesterol: 222 mg/dL — ABNORMAL HIGH (ref 0–200)
HDL: 35.6 mg/dL — AB (ref >39.00)
NONHDL: 186.4
Triglycerides: 341 mg/dL — ABNORMAL HIGH (ref 0.0–149.0)
VLDL: 68.2 mg/dL — ABNORMAL HIGH (ref 0.0–40.0)

## 2014-02-21 LAB — TSH: TSH: 1.02 u[IU]/mL (ref 0.35–4.50)

## 2014-02-21 LAB — LDL CHOLESTEROL, DIRECT: LDL DIRECT: 128.1 mg/dL

## 2014-02-21 LAB — PSA: PSA: 0.04 ng/mL — AB (ref 0.10–4.00)

## 2014-02-26 DIAGNOSIS — H25813 Combined forms of age-related cataract, bilateral: Secondary | ICD-10-CM | POA: Diagnosis not present

## 2014-02-26 DIAGNOSIS — H3532 Exudative age-related macular degeneration: Secondary | ICD-10-CM | POA: Diagnosis not present

## 2014-02-26 DIAGNOSIS — N320 Bladder-neck obstruction: Secondary | ICD-10-CM | POA: Insufficient documentation

## 2014-02-26 DIAGNOSIS — H40013 Open angle with borderline findings, low risk, bilateral: Secondary | ICD-10-CM | POA: Diagnosis not present

## 2014-02-26 NOTE — Assessment & Plan Note (Signed)
stable overall by history and exam, recent data reviewed with pt, and pt to continue medical treatment as before,  to f/u any worsening symptoms or concerns BP Readings from Last 3 Encounters:  02/20/14 110/74  08/22/13 132/70  03/28/13 120/72

## 2014-02-26 NOTE — Assessment & Plan Note (Signed)
Asympt, for f/u psa as he is due 

## 2014-02-26 NOTE — Assessment & Plan Note (Signed)
stable overall by history and exam, recent data reviewed with pt, and pt to continue medical treatment as before,  to f/u any worsening symptoms or concerns Lab Results  Component Value Date   LDLCALC 65 07/20/2011

## 2014-02-26 NOTE — Assessment & Plan Note (Signed)
stable overall by history and exam, recent data reviewed with pt, and pt to continue medical treatment as before,  to f/u any worsening symptoms or concerns Lab Results  Component Value Date   HGBA1C 6.8* 02/20/2014   For f/u lab

## 2014-02-26 NOTE — Assessment & Plan Note (Signed)
No overt bleeding or bruising, stable overall by history and exam, recent data reviewed with pt, and pt to continue medical treatment as before,  to f/u any worsening symptoms or concerns, for f/u lab Lab Results  Component Value Date   WBC 4.3 02/20/2014   HGB 11.4* 02/20/2014   HCT 33.1* 02/20/2014   MCV 84.5 02/20/2014   PLT 248.0 02/20/2014

## 2014-03-01 ENCOUNTER — Encounter: Payer: Self-pay | Admitting: Internal Medicine

## 2014-03-01 ENCOUNTER — Ambulatory Visit (INDEPENDENT_AMBULATORY_CARE_PROVIDER_SITE_OTHER): Payer: Medicare Other | Admitting: Internal Medicine

## 2014-03-01 ENCOUNTER — Other Ambulatory Visit (INDEPENDENT_AMBULATORY_CARE_PROVIDER_SITE_OTHER): Payer: Medicare Other

## 2014-03-01 VITALS — BP 132/76 | HR 78 | Temp 98.0°F | Wt 222.2 lb

## 2014-03-01 DIAGNOSIS — N289 Disorder of kidney and ureter, unspecified: Secondary | ICD-10-CM

## 2014-03-01 DIAGNOSIS — N179 Acute kidney failure, unspecified: Secondary | ICD-10-CM

## 2014-03-01 DIAGNOSIS — E1159 Type 2 diabetes mellitus with other circulatory complications: Secondary | ICD-10-CM

## 2014-03-01 DIAGNOSIS — E1169 Type 2 diabetes mellitus with other specified complication: Secondary | ICD-10-CM | POA: Diagnosis not present

## 2014-03-01 DIAGNOSIS — I10 Essential (primary) hypertension: Secondary | ICD-10-CM

## 2014-03-01 DIAGNOSIS — E119 Type 2 diabetes mellitus without complications: Secondary | ICD-10-CM

## 2014-03-01 LAB — BASIC METABOLIC PANEL
BUN: 24 mg/dL — ABNORMAL HIGH (ref 6–23)
CHLORIDE: 106 meq/L (ref 96–112)
CO2: 23 mEq/L (ref 19–32)
Calcium: 9.1 mg/dL (ref 8.4–10.5)
Creatinine, Ser: 2 mg/dL — ABNORMAL HIGH (ref 0.4–1.5)
GFR: 36.08 mL/min — ABNORMAL LOW (ref >60.00)
Glucose, Bld: 98 mg/dL (ref 70–99)
Potassium: 4.1 mEq/L (ref 3.5–5.1)
Sodium: 139 mEq/L (ref 135–145)

## 2014-03-01 NOTE — Patient Instructions (Signed)
OK to stay off the benicar HCT, and hold on further new medication today  Please continue all other medications as before, and refills have been done if requested.  Please have the pharmacy call with any other refills you may need.  Please continue your efforts at being more active, low cholesterol diet, and weight control.  You are otherwise up to date with prevention measures today.  Please keep your appointments with your specialists as you may have planned  Please go to the LAB in the Basement (turn left off the elevator) for the tests to be done today  You will be contacted by phone if any changes need to be made immediately.  Otherwise, you will receive a letter about your results with an explanation, but please check with MyChart first.  Please remember to sign up for MyChart if you have not done so, as this will be important to you in the future with finding out test results, communicating by private email, and scheduling acute appointments online when needed.

## 2014-03-01 NOTE — Progress Notes (Signed)
Pre visit review using our clinic review tool, if applicable. No additional management support is needed unless otherwise documented below in the visit note. 

## 2014-03-01 NOTE — Progress Notes (Signed)
Subjective:    Patient ID: Adrian Bass, male    DOB: [birth year], 73 y.o.   MRN: [id number]  HPI  Here to f/u recent finding AKI with last labs, having held the benicar HCT;now  overall doing ok,  Pt denies chest pain, increased sob or doe, wheezing, orthopnea, PND, increased LE swelling, palpitations, dizziness or syncope.  Pt denies polydipsia, polyuria, or low sugar symptoms such as weakness or confusion improved with po intake.  Pt denies new neurological symptoms such as new headache, or facial or extremity weakness or numbness.   Pt states overall good compliance with meds, has been trying to follow lower cholesterol, diabetic diet. Denies urinary symptoms such as dysuria, frequency, urgency, flank pain, hematuria or n/v, fever, chills. Past Medical History  Diagnosis Date  . Hypertension   . Hyperlipidemia   . Adenocarcinoma   . Prostate cancer 04/25/12    Gleason 4+3=7,&4+5=9,PSA=70.71,Volume=104.6cc  . BPH with obstruction/lower urinary tract symptoms   . Diverticulosis     hx  . Diabetes mellitus     Type II  . Anemia 02/20/2014   Past Surgical History  Procedure Laterality Date  . Prostate biopsy  04/25/12    Adenocarcinoma  . Tonsillectomy      reports that he has never smoked. He has never used smokeless tobacco. He reports that he drinks about .6 ounces of alcohol per week. He reports that he does not use illicit drugs. family history includes Heart disease in his father and mother. There is no history of Cancer, Depression, or Stroke. No Known Allergies Current Outpatient Prescriptions on File Prior to Visit  Medication Sig Dispense Refill  . atorvastatin (LIPITOR) 40 MG tablet TAKE ONE TABLET BY MOUTH ONE TIME DAILY  30 tablet  11  . Choline Fenofibrate 135 MG capsule Take 1 capsule (135 mg total) by mouth daily.  30 capsule  11  . glipiZIDE (GLUCOTROL) 10 MG tablet Take 1 tablet (10 mg total) by mouth 2 (two) times daily before a meal.  60 tablet  2  .  metFORMIN (GLUCOPHAGE) 850 MG tablet Take 1 tablet (850 mg total) by mouth 2 (two) times daily with a meal.  180 tablet  3  . metoprolol succinate (TOPROL-XL) 25 MG 24 hr tablet TAKE ONE TABLET BY MOUTH ONE TIME DAILY   90 tablet  2  . sertraline (ZOLOFT) 50 MG tablet Take 1 tablet (50 mg total) by mouth daily.  90 tablet  3  . vardenafil (LEVITRA) 20 MG tablet Take 1 tablet (20 mg total) by mouth as needed for erectile dysfunction.  40 tablet  5   No current facility-administered medications on file prior to visit.   Review of Systems  Constitutional: Negative for unusual diaphoresis or other sweats  HENT: Negative for ringing in ear Eyes: Negative for double vision or worsening visual disturbance.  Respiratory: Negative for choking and stridor.   Gastrointestinal: Negative for vomiting or other signifcant bowel change Genitourinary: Negative for hematuria or decreased urine volume.  Musculoskeletal: Negative for other MSK pain or swelling Skin: Negative for color change and worsening wound.  Neurological: Negative for tremors and numbness other than noted  Psychiatric/Behavioral: Negative for decreased concentration or agitation other than above       Objective:   Physical Exam BP 132/76  Pulse 78  Temp(Src) 98 F (36.7 C) (Oral)  Wt 222 lb 4 oz (100.812 kg)  SpO2 95% VS noted,  Constitutional: Pt appears well-developed, well-nourished.  HENT:  Head: NCAT.  Right Ear: External ear normal.  Left Ear: External ear normal.  Eyes: . Pupils are equal, round, and reactive to light. Conjunctivae and EOM are normal Neck: Normal range of motion. Neck supple.  Cardiovascular: Normal rate and regular rhythm.   Pulmonary/Chest: Effort normal and breath sounds normal.  Abd:  Soft, NT, ND, + BS Neurological: Pt is alert. Not confused , motor grossly intact Skin: Skin is warm. No rash Psychiatric: Pt behavior is normal. No agitation.   BP Readings from Last 3 Encounters:  03/01/14 132/76   02/20/14 110/74  08/22/13 132/70       Assessment & Plan:

## 2014-03-03 DIAGNOSIS — N179 Acute kidney failure, unspecified: Secondary | ICD-10-CM | POA: Insufficient documentation

## 2014-03-03 NOTE — Assessment & Plan Note (Signed)
stable overall by history and exam, recent data reviewed with pt, and pt to continue medical treatment as before,  to f/u any worsening symptoms or concerns Lab Results  Component Value Date   LDLCALC 65 07/20/2011

## 2014-03-03 NOTE — Assessment & Plan Note (Addendum)
Now off the benicar hct for 1 wk, asympt, stable overall by history and exam, recent data reviewed with pt, and pt to continue medical treatment as before,  to f/u any worsening symptoms or concerns, for f/u lab today, consider renal referral

## 2014-03-03 NOTE — Assessment & Plan Note (Signed)
Mild increased today but still overall < 140/90, will hold on further med, but consider losartan 50 lower dose if cr improved

## 2014-03-09 ENCOUNTER — Telehealth: Payer: Self-pay

## 2014-03-09 NOTE — Telephone Encounter (Signed)
Pt called wanting to know why he hasn't heard anything about his lab results that he had last week. He was very rude and upset that nobody "bothered to call" to let him know anything. Please advise.

## 2014-03-09 NOTE — Telephone Encounter (Signed)
Called the patient informed of results from 03/01/14 lab and that a letter of results along with explanation was mailed on 03/01/14.  Patient did verbalize understanding of results and instructions by PCP.

## 2014-03-21 DIAGNOSIS — H35051 Retinal neovascularization, unspecified, right eye: Secondary | ICD-10-CM | POA: Diagnosis not present

## 2014-03-21 DIAGNOSIS — H3532 Exudative age-related macular degeneration: Secondary | ICD-10-CM | POA: Diagnosis not present

## 2014-03-28 DIAGNOSIS — C61 Malignant neoplasm of prostate: Secondary | ICD-10-CM | POA: Diagnosis not present

## 2014-04-03 ENCOUNTER — Other Ambulatory Visit: Payer: Self-pay | Admitting: Internal Medicine

## 2014-04-04 ENCOUNTER — Telehealth: Payer: Self-pay | Admitting: Internal Medicine

## 2014-04-04 DIAGNOSIS — R3916 Straining to void: Secondary | ICD-10-CM | POA: Diagnosis not present

## 2014-04-04 DIAGNOSIS — N401 Enlarged prostate with lower urinary tract symptoms: Secondary | ICD-10-CM | POA: Diagnosis not present

## 2014-04-04 DIAGNOSIS — C61 Malignant neoplasm of prostate: Secondary | ICD-10-CM | POA: Diagnosis not present

## 2014-04-04 NOTE — Telephone Encounter (Signed)
Patient informed. 

## 2014-04-04 NOTE — Telephone Encounter (Signed)
Quite a few patients see Dr Nevada Crane locally , thanks

## 2014-04-04 NOTE — Telephone Encounter (Signed)
Pt called wondering if Dr. Jenny Reichmann know any good dermatologist, not requesting referral just want to know if Dr. Jenny Reichmann know one.

## 2014-04-25 DIAGNOSIS — D225 Melanocytic nevi of trunk: Secondary | ICD-10-CM | POA: Diagnosis not present

## 2014-04-25 DIAGNOSIS — L570 Actinic keratosis: Secondary | ICD-10-CM | POA: Diagnosis not present

## 2014-04-25 DIAGNOSIS — X32XXXA Exposure to sunlight, initial encounter: Secondary | ICD-10-CM | POA: Diagnosis not present

## 2014-04-25 DIAGNOSIS — B079 Viral wart, unspecified: Secondary | ICD-10-CM | POA: Diagnosis not present

## 2014-05-24 DIAGNOSIS — H3532 Exudative age-related macular degeneration: Secondary | ICD-10-CM | POA: Diagnosis not present

## 2014-05-24 DIAGNOSIS — H35051 Retinal neovascularization, unspecified, right eye: Secondary | ICD-10-CM | POA: Diagnosis not present

## 2014-06-27 ENCOUNTER — Other Ambulatory Visit: Payer: Self-pay | Admitting: *Deleted

## 2014-07-03 ENCOUNTER — Other Ambulatory Visit: Payer: Self-pay | Admitting: *Deleted

## 2014-07-03 MED ORDER — GLIPIZIDE 10 MG PO TABS: 10.0000 mg | ORAL_TABLET | Freq: Two times a day (BID) | ORAL | Status: DC

## 2014-07-13 ENCOUNTER — Telehealth: Payer: Self-pay | Admitting: Internal Medicine

## 2014-07-13 ENCOUNTER — Other Ambulatory Visit: Payer: Self-pay | Admitting: Internal Medicine

## 2014-07-13 MED ORDER — SILDENAFIL CITRATE 100 MG PO TABS: 100.0000 mg | ORAL_TABLET | Freq: Every day | ORAL | Status: DC | PRN

## 2014-07-13 NOTE — Telephone Encounter (Signed)
Done hardcopy to dustin

## 2014-07-23 DIAGNOSIS — H3532 Exudative age-related macular degeneration: Secondary | ICD-10-CM | POA: Diagnosis not present

## 2014-07-23 DIAGNOSIS — H35051 Retinal neovascularization, unspecified, right eye: Secondary | ICD-10-CM | POA: Diagnosis not present

## 2014-08-09 ENCOUNTER — Other Ambulatory Visit: Payer: Self-pay | Admitting: Internal Medicine

## 2014-08-09 MED ORDER — VARDENAFIL HCL 20 MG PO TABS: 20.0000 mg | ORAL_TABLET | ORAL | Status: DC | PRN

## 2014-08-09 NOTE — Telephone Encounter (Addendum)
Script for Levitra has been faxed to [facility] @ [phone] per patient.  [facility] phone # is [phone]

## 2014-08-09 NOTE — Telephone Encounter (Signed)
Done hardcopy to Cherina  

## 2014-08-14 ENCOUNTER — Other Ambulatory Visit: Payer: Self-pay | Admitting: Internal Medicine

## 2014-08-16 ENCOUNTER — Telehealth: Payer: Self-pay | Admitting: Internal Medicine

## 2014-08-16 NOTE — Telephone Encounter (Signed)
Please clarify, as there is no request attached

## 2014-08-16 NOTE — Telephone Encounter (Signed)
Pt called to check up on this request.  °

## 2014-08-16 NOTE — Telephone Encounter (Signed)
We sent in 08/15/14 to [employer], pt will check with them.

## 2014-08-27 DIAGNOSIS — H31093 Other chorioretinal scars, bilateral: Secondary | ICD-10-CM | POA: Diagnosis not present

## 2014-08-27 DIAGNOSIS — H40013 Open angle with borderline findings, low risk, bilateral: Secondary | ICD-10-CM | POA: Diagnosis not present

## 2014-08-27 DIAGNOSIS — H3532 Exudative age-related macular degeneration: Secondary | ICD-10-CM | POA: Diagnosis not present

## 2014-08-27 DIAGNOSIS — H2513 Age-related nuclear cataract, bilateral: Secondary | ICD-10-CM | POA: Diagnosis not present

## 2014-08-27 DIAGNOSIS — H25043 Posterior subcapsular polar age-related cataract, bilateral: Secondary | ICD-10-CM | POA: Diagnosis not present

## 2014-08-27 DIAGNOSIS — H3531 Nonexudative age-related macular degeneration: Secondary | ICD-10-CM | POA: Diagnosis not present

## 2014-08-28 ENCOUNTER — Ambulatory Visit (INDEPENDENT_AMBULATORY_CARE_PROVIDER_SITE_OTHER): Payer: Medicare Other | Admitting: Internal Medicine

## 2014-08-28 ENCOUNTER — Other Ambulatory Visit (INDEPENDENT_AMBULATORY_CARE_PROVIDER_SITE_OTHER): Payer: Medicare Other

## 2014-08-28 ENCOUNTER — Encounter: Payer: Self-pay | Admitting: Internal Medicine

## 2014-08-28 VITALS — BP 130/82 | HR 82 | Temp 98.2°F | Resp 18 | Ht 72.0 in | Wt 219.0 lb

## 2014-08-28 DIAGNOSIS — E1159 Type 2 diabetes mellitus with other circulatory complications: Secondary | ICD-10-CM

## 2014-08-28 DIAGNOSIS — E1169 Type 2 diabetes mellitus with other specified complication: Secondary | ICD-10-CM | POA: Diagnosis not present

## 2014-08-28 DIAGNOSIS — E119 Type 2 diabetes mellitus without complications: Secondary | ICD-10-CM

## 2014-08-28 DIAGNOSIS — E785 Hyperlipidemia, unspecified: Secondary | ICD-10-CM

## 2014-08-28 DIAGNOSIS — N183 Chronic kidney disease, stage 3 unspecified: Secondary | ICD-10-CM | POA: Insufficient documentation

## 2014-08-28 DIAGNOSIS — I10 Essential (primary) hypertension: Secondary | ICD-10-CM

## 2014-08-28 DIAGNOSIS — I152 Hypertension secondary to endocrine disorders: Secondary | ICD-10-CM

## 2014-08-28 LAB — HEPATIC FUNCTION PANEL
ALBUMIN: 4.1 g/dL (ref 3.5–5.2)
ALK PHOS: 92 U/L (ref 39–117)
ALT: 29 U/L (ref 0–53)
AST: 29 U/L (ref 0–37)
BILIRUBIN TOTAL: 0.5 mg/dL (ref 0.2–1.2)
Bilirubin, Direct: 0.1 mg/dL (ref 0.0–0.3)
Total Protein: 6.8 g/dL (ref 6.0–8.3)

## 2014-08-28 LAB — LIPID PANEL
CHOLESTEROL: 216 mg/dL — AB (ref 0–200)
HDL: 39.9 mg/dL (ref >39.00)
NonHDL: 176.1
Total CHOL/HDL Ratio: 5
VLDL: 80 mg/dL — ABNORMAL HIGH (ref 0.0–40.0)

## 2014-08-28 LAB — LDL CHOLESTEROL, DIRECT: LDL DIRECT: 108 mg/dL

## 2014-08-28 LAB — HEMOGLOBIN A1C: HEMOGLOBIN A1C: 7 % — AB (ref 4.6–6.5)

## 2014-08-28 LAB — BASIC METABOLIC PANEL
BUN: 15 mg/dL (ref 6–23)
CALCIUM: 9.6 mg/dL (ref 8.4–10.5)
CO2: 27 meq/L (ref 19–32)
Chloride: 103 mEq/L (ref 96–112)
Creatinine, Ser: 1.33 mg/dL (ref 0.40–1.50)
GFR: 56.03 mL/min — ABNORMAL LOW (ref >60.00)
GLUCOSE: 113 mg/dL — AB (ref 70–99)
Potassium: 4.7 mEq/L (ref 3.5–5.1)
SODIUM: 136 meq/L (ref 135–145)

## 2014-08-28 MED ORDER — VARDENAFIL HCL 20 MG PO TABS: 20.0000 mg | ORAL_TABLET | ORAL | Status: DC | PRN

## 2014-08-28 NOTE — Assessment & Plan Note (Signed)
stable overall by history and exam, recent data reviewed with pt, and pt to continue medical treatment as before,  to f/u any worsening symptoms or concerns, to hold further ace/arb for now due to renal fxn BP Readings from Last 3 Encounters:  08/28/14 130/82  03/01/14 132/76  02/20/14 110/74

## 2014-08-28 NOTE — Progress Notes (Signed)
Pre visit review using our clinic review tool, if applicable. No additional management support is needed unless otherwise documented below in the visit note. 

## 2014-08-28 NOTE — Assessment & Plan Note (Signed)
stable overall by history and exam, recent data reviewed with pt, and pt to continue medical treatment as before,  to f/u any worsening symptoms or concerns Lab Results  Component Value Date   LDLCALC 65 07/20/2011

## 2014-08-28 NOTE — Assessment & Plan Note (Signed)
stable overall by history and exam, recent data reviewed with pt, and pt to continue medical treatment as before,  to f/u any worsening symptoms or concerns Lab Results  Component Value Date   HGBA1C 6.8* 02/20/2014   For f/u labs

## 2014-08-28 NOTE — Patient Instructions (Signed)

## 2014-08-28 NOTE — Progress Notes (Signed)
Subjective:    Patient ID: Adrian Bass, male    DOB: 05/12/1941, 74 y.o.   MRN: [id number]  HPI   Here to f/u; overall doing ok,  Pt denies chest pain, increasing sob or doe, wheezing, orthopnea, PND, increased LE swelling, palpitations, dizziness or syncope.  Pt denies new neurological symptoms such as new headache, or facial or extremity weakness or numbness.  Pt denies polydipsia, polyuria, or low sugar episode.   Pt denies new neurological symptoms such as new headache, or facial or extremity weakness or numbness.   Pt states overall good compliance with meds, mostly trying to follow appropriate diet, with wt overall stable,  but does exercise however several times per wk at the [employer]  Was taken off benicarHCT 02/2014 due to worsening renal fxn Past Medical History  Diagnosis Date  . Hypertension   . Hyperlipidemia   . Adenocarcinoma   . Prostate cancer 04/25/12    Gleason 4+3=7,&4+5=9,PSA=70.71,Volume=104.6cc  . BPH with obstruction/lower urinary tract symptoms   . Diverticulosis     hx  . Diabetes mellitus     Type II  . Anemia 02/20/2014   Past Surgical History  Procedure Laterality Date  . Prostate biopsy  04/25/12    Adenocarcinoma  . Tonsillectomy      reports that he has never smoked. He has never used smokeless tobacco. He reports that he drinks about 0.6 oz of alcohol per week. He reports that he does not use illicit drugs. family history includes Heart disease in his father and mother. There is no history of Cancer, Depression, or Stroke. No Known Allergies Current Outpatient Prescriptions on File Prior to Visit  Medication Sig Dispense Refill  . atorvastatin (LIPITOR) 40 MG tablet TAKE ONE TABLET BY MOUTH ONE TIME DAILY 30 tablet 11  . Choline Fenofibrate 135 MG capsule Take 1 capsule (135 mg total) by mouth daily. 30 capsule 11  . glipiZIDE (GLUCOTROL) 10 MG tablet Take 1 tablet (10 mg total) by mouth 2 (two) times daily before a meal. 60 tablet 2  . metFORMIN  (GLUCOPHAGE) 850 MG tablet TAKE ONE TABLET BY MOUTH TWICE A DAY WITH MEALS  60 tablet 10  . metoprolol succinate (TOPROL-XL) 25 MG 24 hr tablet TAKE ONE TABLET BY MOUTH ONE TIME DAILY  90 tablet 3  . sertraline (ZOLOFT) 50 MG tablet Take 1 tablet (50 mg total) by mouth daily. 90 tablet 3   No current facility-administered medications on file prior to visit.   Review of Systems  Constitutional: Negative for unusual diaphoresis or night sweats HENT: Negative for ringing in ear or discharge Eyes: Negative for double vision or worsening visual disturbance.  Respiratory: Negative for choking and stridor.   Gastrointestinal: Negative for vomiting or other signifcant bowel change Genitourinary: Negative for hematuria or change in urine volume.  Musculoskeletal: Negative for other MSK pain or swelling Skin: Negative for color change and worsening wound.  Neurological: Negative for tremors and numbness other than noted  Psychiatric/Behavioral: Negative for decreased concentration or agitation other than above       Objective:   Physical Exam BP 130/82 mmHg  Pulse 82  Temp(Src) 98.2 F (36.8 C) (Oral)  Resp 18  Ht 6' (1.829 m)  Wt 219 lb (99.338 kg)  BMI 29.70 kg/m2  SpO2 97% VS noted,  Constitutional: Pt appears in no significant distress HENT: Head: NCAT.  Right Ear: External ear normal.  Left Ear: External ear normal.  Eyes: . Pupils are equal,  round, and reactive to light. Conjunctivae and EOM are normal Neck: Normal range of motion. Neck supple.  Cardiovascular: Normal rate and regular rhythm.   Pulmonary/Chest: Effort normal and breath sounds without rales or wheezing.  Abd:  Soft, NT, ND, + BS Neurological: Pt is alert. Not confused , motor grossly intact Skin: Skin is warm. No rash, no LE edema Psychiatric: Pt behavior is normal. No agitation.        Assessment & Plan:

## 2014-08-29 ENCOUNTER — Encounter: Payer: Self-pay | Admitting: Internal Medicine

## 2014-10-08 DIAGNOSIS — C61 Malignant neoplasm of prostate: Secondary | ICD-10-CM | POA: Diagnosis not present

## 2014-10-09 DIAGNOSIS — H3532 Exudative age-related macular degeneration: Secondary | ICD-10-CM | POA: Diagnosis not present

## 2014-10-10 DIAGNOSIS — R350 Frequency of micturition: Secondary | ICD-10-CM | POA: Diagnosis not present

## 2014-10-10 DIAGNOSIS — N401 Enlarged prostate with lower urinary tract symptoms: Secondary | ICD-10-CM | POA: Diagnosis not present

## 2014-10-10 DIAGNOSIS — C61 Malignant neoplasm of prostate: Secondary | ICD-10-CM | POA: Diagnosis not present

## 2014-10-10 DIAGNOSIS — R3916 Straining to void: Secondary | ICD-10-CM | POA: Diagnosis not present

## 2014-10-23 DIAGNOSIS — H18821 Corneal disorder due to contact lens, right eye: Secondary | ICD-10-CM | POA: Diagnosis not present

## 2014-10-30 ENCOUNTER — Other Ambulatory Visit: Payer: Self-pay | Admitting: Internal Medicine

## 2015-01-03 DIAGNOSIS — H3532 Exudative age-related macular degeneration: Secondary | ICD-10-CM | POA: Diagnosis not present

## 2015-02-12 ENCOUNTER — Other Ambulatory Visit: Payer: Self-pay | Admitting: Internal Medicine

## 2015-03-06 ENCOUNTER — Encounter: Payer: Self-pay | Admitting: Internal Medicine

## 2015-03-06 ENCOUNTER — Ambulatory Visit (INDEPENDENT_AMBULATORY_CARE_PROVIDER_SITE_OTHER): Payer: Medicare Other | Admitting: Internal Medicine

## 2015-03-06 ENCOUNTER — Other Ambulatory Visit (INDEPENDENT_AMBULATORY_CARE_PROVIDER_SITE_OTHER): Payer: Medicare Other

## 2015-03-06 VITALS — BP 128/66 | HR 78 | Wt 223.0 lb

## 2015-03-06 DIAGNOSIS — E119 Type 2 diabetes mellitus without complications: Secondary | ICD-10-CM

## 2015-03-06 DIAGNOSIS — E781 Pure hyperglyceridemia: Secondary | ICD-10-CM

## 2015-03-06 DIAGNOSIS — E785 Hyperlipidemia, unspecified: Secondary | ICD-10-CM | POA: Diagnosis not present

## 2015-03-06 DIAGNOSIS — N320 Bladder-neck obstruction: Secondary | ICD-10-CM

## 2015-03-06 DIAGNOSIS — E1159 Type 2 diabetes mellitus with other circulatory complications: Secondary | ICD-10-CM | POA: Diagnosis not present

## 2015-03-06 DIAGNOSIS — N183 Chronic kidney disease, stage 3 unspecified: Secondary | ICD-10-CM

## 2015-03-06 DIAGNOSIS — I10 Essential (primary) hypertension: Secondary | ICD-10-CM

## 2015-03-06 LAB — HEPATIC FUNCTION PANEL
ALT: 35 U/L (ref 0–53)
AST: 45 U/L — ABNORMAL HIGH (ref 0–37)
Albumin: 4.2 g/dL (ref 3.5–5.2)
Alkaline Phosphatase: 72 U/L (ref 39–117)
BILIRUBIN DIRECT: 0.1 mg/dL (ref 0.0–0.3)
TOTAL PROTEIN: 6.7 g/dL (ref 6.0–8.3)
Total Bilirubin: 0.4 mg/dL (ref 0.2–1.2)

## 2015-03-06 LAB — URINALYSIS, ROUTINE W REFLEX MICROSCOPIC
BILIRUBIN URINE: NEGATIVE
HGB URINE DIPSTICK: NEGATIVE
Leukocytes, UA: NEGATIVE
Nitrite: NEGATIVE
Specific Gravity, Urine: 1.025 (ref 1.000–1.030)
Total Protein, Urine: NEGATIVE
Urine Glucose: NEGATIVE
Urobilinogen, UA: 0.2 (ref 0.0–1.0)
pH: 6 (ref 5.0–8.0)

## 2015-03-06 LAB — CBC WITH DIFFERENTIAL/PLATELET
BASOS ABS: 0 10*3/uL (ref 0.0–0.1)
Basophils Relative: 0.4 % (ref 0.0–3.0)
EOS ABS: 0.1 10*3/uL (ref 0.0–0.7)
Eosinophils Relative: 1.9 % (ref 0.0–5.0)
HEMATOCRIT: 33.9 % — AB (ref 39.0–52.0)
Hemoglobin: 11 g/dL — ABNORMAL LOW (ref 13.0–17.0)
LYMPHS ABS: 0.5 10*3/uL — AB (ref 0.7–4.0)
LYMPHS PCT: 9.9 % — AB (ref 12.0–46.0)
MCHC: 32.5 g/dL (ref 30.0–36.0)
MCV: 75.1 fl — ABNORMAL LOW (ref 78.0–100.0)
MONO ABS: 0.5 10*3/uL (ref 0.1–1.0)
Monocytes Relative: 9.7 % (ref 3.0–12.0)
NEUTROS ABS: 3.8 10*3/uL (ref 1.4–7.7)
NEUTROS PCT: 78.1 % — AB (ref 43.0–77.0)
PLATELETS: 225 10*3/uL (ref 150.0–400.0)
RBC: 4.51 Mil/uL (ref 4.22–5.81)
RDW: 17.1 % — ABNORMAL HIGH (ref 11.5–15.5)
WBC: 4.9 10*3/uL (ref 4.0–10.5)

## 2015-03-06 LAB — PSA: PSA: 0.02 ng/mL — ABNORMAL LOW (ref 0.10–4.00)

## 2015-03-06 LAB — BASIC METABOLIC PANEL
BUN: 20 mg/dL (ref 6–23)
CALCIUM: 9.6 mg/dL (ref 8.4–10.5)
CO2: 24 meq/L (ref 19–32)
CREATININE: 1.53 mg/dL — AB (ref 0.40–1.50)
Chloride: 105 mEq/L (ref 96–112)
GFR: 47.6 mL/min — AB (ref >60.00)
Glucose, Bld: 104 mg/dL — ABNORMAL HIGH (ref 70–99)
Potassium: 4.7 mEq/L (ref 3.5–5.1)
SODIUM: 140 meq/L (ref 135–145)

## 2015-03-06 LAB — LIPID PANEL
CHOL/HDL RATIO: 5
Cholesterol: 216 mg/dL — ABNORMAL HIGH (ref 0–200)
HDL: 45.3 mg/dL (ref >39.00)
NONHDL: 170.9
TRIGLYCERIDES: 219 mg/dL — AB (ref 0.0–149.0)
VLDL: 43.8 mg/dL — AB (ref 0.0–40.0)

## 2015-03-06 LAB — TSH: TSH: 1.26 u[IU]/mL (ref 0.35–4.50)

## 2015-03-06 LAB — MICROALBUMIN / CREATININE URINE RATIO
Creatinine,U: 268.1 mg/dL
Microalb Creat Ratio: 0.6 mg/g (ref 0.0–30.0)
Microalb, Ur: 1.6 mg/dL (ref 0.0–1.9)

## 2015-03-06 LAB — LDL CHOLESTEROL, DIRECT: Direct LDL: 129 mg/dL

## 2015-03-06 LAB — HEMOGLOBIN A1C: Hgb A1c MFr Bld: 7.1 % — ABNORMAL HIGH (ref 4.6–6.5)

## 2015-03-06 MED ORDER — CHOLINE FENOFIBRATE 135 MG PO CPDR: 135.0000 mg | DELAYED_RELEASE_CAPSULE | Freq: Every day | ORAL | Status: DC

## 2015-03-06 MED ORDER — ROSUVASTATIN CALCIUM 40 MG PO TABS
40.0000 mg | ORAL_TABLET | Freq: Every day | ORAL | Status: DC
Start: 2015-03-06 — End: 2015-04-03

## 2015-03-06 NOTE — Progress Notes (Signed)
Subjective:    Patient ID: Adrian Bass, male    DOB: Nov 17, 1940, 74 y.o.   MRN: [id number]  HPI  Here for yearly f/u;  Overall doing ok;  Pt denies Chest pain, worsening SOB, DOE, wheezing, orthopnea, PND, worsening LE edema, palpitations, dizziness or syncope.  Pt denies neurological change such as new headache, facial or extremity weakness.  Pt denies polydipsia, polyuria, or low sugar symptoms. Pt states overall good compliance with treatment and medications, good tolerability, and has been trying to follow appropriate diet.  Pt denies worsening depressive symptoms, suicidal ideation or panic. No fever, night sweats, wt loss, loss of appetite, or other constitutional symptoms.  Pt states good ability with ADL's, has low fall risk, home safety reviewed and adequate, no other significant changes in hearing or vision, and only occasionally active with exercise. Works partime in [employer].  Declines tetanus and flu shots.  Thinks he may not be taking the statin and tricor but willing to restart and even able to change to crestor generic. Has mild CTS liek symtpoms to waking in the am. Past Medical History  Diagnosis Date  . Hypertension   . Hyperlipidemia   . Adenocarcinoma ([place])   . Prostate cancer ([place]) 04/25/12    Gleason 4+3=7,&4+5=9,PSA=70.71,Volume=104.6cc  . BPH with obstruction/lower urinary tract symptoms   . Diverticulosis     hx  . Diabetes mellitus     Type II  . Anemia 02/20/2014   Past Surgical History  Procedure Laterality Date  . Prostate biopsy  04/25/12    Adenocarcinoma  . Tonsillectomy      reports that he has never smoked. He has never used smokeless tobacco. He reports that he drinks about 0.6 oz of alcohol per week. He reports that he does not use illicit drugs. family history includes Heart disease in his father and mother. There is no history of Cancer, Depression, or Stroke. No Known Allergies Current Outpatient Prescriptions on File Prior to  Visit  Medication Sig Dispense Refill  . atorvastatin (LIPITOR) 40 MG tablet TAKE ONE TABLET BY MOUTH ONE TIME DAILY 30 tablet 11  . glipiZIDE (GLUCOTROL) 10 MG tablet TAKE ONE TABLET BY MOUTH TWICE DAILY BEFORE A MEAL 180 tablet 1  . metFORMIN (GLUCOPHAGE) 850 MG tablet TAKE ONE TABLET BY MOUTH TWICE A DAY WITH MEALS  60 tablet 10  . metoprolol succinate (TOPROL-XL) 25 MG 24 hr tablet TAKE ONE TABLET BY MOUTH ONE TIME DAILY  90 tablet 3  . sertraline (ZOLOFT) 50 MG tablet TAKE ONE TABLET BY MOUTH ONE TIME DAILY 30 tablet 6  . vardenafil (LEVITRA) 20 MG tablet Take 1 tablet (20 mg total) by mouth as needed for erectile dysfunction. 10 tablet 11  . Choline Fenofibrate 135 MG capsule Take 1 capsule (135 mg total) by mouth daily. (Patient not taking: Reported on 03/06/2015) 30 capsule 11   No current facility-administered medications on file prior to visit.   Review of Systems Constitutional: Negative for increased diaphoresis, other activity, appetite or siginficant weight change other than noted HENT: Negative for worsening hearing loss, ear pain, facial swelling, mouth sores and neck stiffness.   Eyes: Negative for other worsening pain, redness or visual disturbance.  Respiratory: Negative for shortness of breath and wheezing  Cardiovascular: Negative for chest pain and palpitations.  Gastrointestinal: Negative for diarrhea, blood in stool, abdominal distention or other pain Genitourinary: Negative for hematuria, flank pain or change in urine volume.  Musculoskeletal: Negative for myalgias or  other joint complaints.  Skin: Negative for color change and wound or drainage.  Neurological: Negative for syncope and numbness. other than noted Hematological: Negative for adenopathy. or other swelling Psychiatric/Behavioral: Negative for hallucinations, SI, self-injury, decreased concentration or other worsening agitation.      Objective:   Physical Exam BP 128/66 mmHg  Pulse 78  Wt 223 lb  (101.152 kg)  SpO2 95% VS noted,  Constitutional: Pt is oriented to person, place, and time. Appears well-developed and well-nourished, in no significant distress Head: Normocephalic and atraumatic.  Right Ear: External ear normal.  Left Ear: External ear normal.  Nose: Nose normal.  Mouth/Throat: Oropharynx is clear and moist.  Eyes: Conjunctivae and EOM are normal. Pupils are equal, round, and reactive to light.  Neck: Normal range of motion. Neck supple. No JVD present. No tracheal deviation present or significant neck LA or mass Cardiovascular: Normal rate, regular rhythm, normal heart sounds and intact distal pulses.   Pulmonary/Chest: Effort normal and breath sounds without rales or wheezing  Abdominal: Soft. Bowel sounds are normal. NT. No HSM  Musculoskeletal: Normal range of motion. Exhibits no edema.  Lymphadenopathy:  Has no cervical adenopathy.  Neurological: Pt is alert and oriented to person, place, and time. Pt has normal reflexes. No cranial nerve deficit. Motor grossly intact Skin: Skin is warm and dry. No rash noted.  Psychiatric:  Has normal mood and affect. Behavior is normal.     Assessment & Plan:

## 2015-03-06 NOTE — Patient Instructions (Signed)
Ok to stop the lipitor  Please take all new medication as prescribed - the crestor 40 mg per day  Please continue all other medications as before, and refills have been done if requested, including the restart tricor for elevated triglycerides  Please have the pharmacy call with any other refills you may need.  Please continue your efforts at being more active, low cholesterol diet, and weight control.  You are otherwise up to date with prevention measures today.  Please keep your appointments with your specialists as you may have planned  Please go to the LAB in the Basement (turn left off the elevator) for the tests to be done today  You will be contacted by phone if any changes need to be made immediately.  Otherwise, you will receive a letter about your results with an explanation, but please check with MyChart first.  Please remember to sign up for MyChart if you have not done so, as this will be important to you in the future with finding out test results, communicating by private email, and scheduling acute appointments online when needed.  Please return in 6 months, or sooner if needed

## 2015-03-06 NOTE — Progress Notes (Signed)
Pre visit review using our clinic review tool, if applicable. No additional management support is needed unless otherwise documented below in the visit note. 

## 2015-03-07 ENCOUNTER — Encounter: Payer: Self-pay | Admitting: Internal Medicine

## 2015-03-26 ENCOUNTER — Telehealth: Payer: Self-pay | Admitting: Internal Medicine

## 2015-03-26 NOTE — Telephone Encounter (Signed)
Patient is upset because he has been mailing Dr. Jenny Reichmann several times wanting to clarify his medications.  He is requesting a call in regards. I did notify patient that contacting us by mail is not the most efficient.  Patient states we need to check our mail.

## 2015-03-27 NOTE — Telephone Encounter (Signed)
Left message on machine for pt to return my call  

## 2015-03-29 NOTE — Telephone Encounter (Signed)
Pt has decided to make and appt to discuss with PCP

## 2015-04-02 ENCOUNTER — Ambulatory Visit: Payer: Medicare Other | Admitting: Internal Medicine

## 2015-04-02 DIAGNOSIS — H2513 Age-related nuclear cataract, bilateral: Secondary | ICD-10-CM | POA: Diagnosis not present

## 2015-04-02 DIAGNOSIS — H353132 Nonexudative age-related macular degeneration, bilateral, intermediate dry stage: Secondary | ICD-10-CM | POA: Diagnosis not present

## 2015-04-02 DIAGNOSIS — H40013 Open angle with borderline findings, low risk, bilateral: Secondary | ICD-10-CM | POA: Diagnosis not present

## 2015-04-02 DIAGNOSIS — H43813 Vitreous degeneration, bilateral: Secondary | ICD-10-CM | POA: Diagnosis not present

## 2015-04-02 DIAGNOSIS — H31093 Other chorioretinal scars, bilateral: Secondary | ICD-10-CM | POA: Diagnosis not present

## 2015-04-03 ENCOUNTER — Ambulatory Visit (INDEPENDENT_AMBULATORY_CARE_PROVIDER_SITE_OTHER): Payer: Medicare Other | Admitting: Internal Medicine

## 2015-04-03 ENCOUNTER — Encounter: Payer: Self-pay | Admitting: Internal Medicine

## 2015-04-03 VITALS — BP 110/80 | HR 78 | Temp 98.2°F | Ht 72.0 in | Wt 224.0 lb

## 2015-04-03 DIAGNOSIS — N183 Chronic kidney disease, stage 3 unspecified: Secondary | ICD-10-CM

## 2015-04-03 DIAGNOSIS — E119 Type 2 diabetes mellitus without complications: Secondary | ICD-10-CM | POA: Diagnosis not present

## 2015-04-03 DIAGNOSIS — E785 Hyperlipidemia, unspecified: Secondary | ICD-10-CM

## 2015-04-03 DIAGNOSIS — E781 Pure hyperglyceridemia: Secondary | ICD-10-CM | POA: Diagnosis not present

## 2015-04-03 MED ORDER — GLIPIZIDE 10 MG PO TABS: ORAL_TABLET | ORAL | Status: AC

## 2015-04-03 MED ORDER — CHOLINE FENOFIBRATE 135 MG PO CPDR: 135.0000 mg | DELAYED_RELEASE_CAPSULE | Freq: Every day | ORAL | Status: AC

## 2015-04-03 MED ORDER — ROSUVASTATIN CALCIUM 40 MG PO TABS: 40.0000 mg | ORAL_TABLET | Freq: Every day | ORAL | Status: AC

## 2015-04-03 MED ORDER — METFORMIN HCL 850 MG PO TABS: 850.0000 mg | ORAL_TABLET | Freq: Two times a day (BID) | ORAL | Status: AC

## 2015-04-03 MED ORDER — VARDENAFIL HCL 20 MG PO TABS: 20.0000 mg | ORAL_TABLET | ORAL | Status: AC | PRN

## 2015-04-03 MED ORDER — SERTRALINE HCL 50 MG PO TABS: 50.0000 mg | ORAL_TABLET | Freq: Every day | ORAL | Status: AC

## 2015-04-03 MED ORDER — METOPROLOL SUCCINATE ER 25 MG PO TB24: 25.0000 mg | ORAL_TABLET | Freq: Every day | ORAL | Status: DC

## 2015-04-03 NOTE — Progress Notes (Signed)
Subjective:    Patient ID: Adrian Bass, male    DOB: 02-Mar-1941, 74 y.o.   MRN: [id number]  HPI Pt quite upset today, somewhat agitated as he has attempted to contact us twice by [place] [employer] and then a phone call regarding "which medication I was supposed to stop."  Finally made an appt, would not discuss with nurse why he was here. Med review with him at visit indicates he is actually not taking the fenofibrate, and has the crestor brand name confused with the generic   Pt denies chest pain, increased sob or doe, wheezing, orthopnea, PND, increased LE swelling, palpitations, dizziness or syncope. Pt denies new neurological symptoms such as new headache, or facial or extremity weakness or numbness.   Pt denies polydipsia, polyuria, or low sugar symptoms such as weakness or confusion improved with po intake.  Pt states overall good compliance with meds, trying to follow lower cholesterol, diabetic diet, wt overall stable. Past Medical History  Diagnosis Date  . Hypertension   . Hyperlipidemia   . Adenocarcinoma ([place])   . Prostate cancer ([place]) 04/25/12    Gleason 4+3=7,&4+5=9,PSA=70.71,Volume=104.6cc  . BPH with obstruction/lower urinary tract symptoms   . Diverticulosis     hx  . Diabetes mellitus     Type II  . Anemia 02/20/2014   Past Surgical History  Procedure Laterality Date  . Prostate biopsy  04/25/12    Adenocarcinoma  . Tonsillectomy      reports that he has never smoked. He has never used smokeless tobacco. He reports that he drinks about 0.6 oz of alcohol per week. He reports that he does not use illicit drugs. family history includes Heart disease in his father and mother. There is no history of Cancer, Depression, or Stroke. No Known Allergies Current Outpatient Prescriptions on File Prior to Visit  Medication Sig Dispense Refill  . Choline Fenofibrate 135 MG capsule Take 1 capsule (135 mg total) by mouth daily. 90 capsule 3  . metFORMIN (GLUCOPHAGE) 850 MG tablet TAKE  ONE TABLET BY MOUTH TWICE A DAY WITH MEALS  60 tablet 10  . metoprolol succinate (TOPROL-XL) 25 MG 24 hr tablet TAKE ONE TABLET BY MOUTH ONE TIME DAILY  90 tablet 3  . rosuvastatin (CRESTOR) 40 MG tablet Take 1 tablet (40 mg total) by mouth daily. 90 tablet 3  . sertraline (ZOLOFT) 50 MG tablet TAKE ONE TABLET BY MOUTH ONE TIME DAILY 30 tablet 6  . vardenafil (LEVITRA) 20 MG tablet Take 1 tablet (20 mg total) by mouth as needed for erectile dysfunction. 10 tablet 11  . glipiZIDE (GLUCOTROL) 10 MG tablet TAKE ONE TABLET BY MOUTH TWICE DAILY BEFORE A MEAL 180 tablet 1   No current facility-administered medications on file prior to visit.   Review of Systems  Constitutional: Negative for unusual diaphoresis or night sweats HENT: Negative for ringing in ear or discharge Eyes: Negative for double vision or worsening visual disturbance.  Respiratory: Negative for choking and stridor.   Gastrointestinal: Negative for vomiting or other signifcant bowel change Genitourinary: Negative for hematuria or change in urine volume.  Musculoskeletal: Negative for other MSK pain or swelling Skin: Negative for color change and worsening wound.  Neurological: Negative for tremors and numbness other than noted  Psychiatric/Behavioral: Negative for decreased concentration or agitation other than above       Objective:   Physical Exam BP 110/80 mmHg  Pulse 78  Temp(Src) 98.2 F (36.8 C) (Oral)  Ht 6' (1.829  m)  Wt 224 lb (101.606 kg)  BMI 30.37 kg/m2  SpO2 97% VS noted, not ill appearing Constitutional: Pt appears in no significant distress HENT: Head: NCAT.  Right Ear: External ear normal.  Left Ear: External ear normal.  Eyes: . Pupils are equal, round, and reactive to light. Conjunctivae and EOM are normal Neck: Normal range of motion. Neck supple.  Cardiovascular: Normal rate and regular rhythm.   Pulmonary/Chest: Effort normal and breath sounds without rales or wheezing.  Neurological: Pt is  alert. Not confused , motor grossly intact Skin: Skin is warm. No rash, no LE edema Psychiatric: Pt behavior is normal. No agitation.     Assessment & Plan:

## 2015-04-03 NOTE — Assessment & Plan Note (Signed)
stable overall by history and exam, recent data reviewed with pt, and pt to continue medical treatment as before,  to f/u any worsening symptoms or concerns Lab Results  Component Value Date   HGBA1C 7.1* 03/06/2015

## 2015-04-03 NOTE — Assessment & Plan Note (Signed)
stable overall by history and exam, recent data reviewed with pt, and pt to continue medical treatment as before,  to f/u any worsening symptoms or concerns Lab Results  Component Value Date   CREATININE 1.53* 03/06/2015

## 2015-04-03 NOTE — Progress Notes (Signed)
Pre visit review using our clinic review tool, if applicable. No additional management support is needed unless otherwise documented below in the visit note. 

## 2015-04-03 NOTE — Patient Instructions (Signed)
Please continue all other medications as before, and refills have been done if requested.  Please have the pharmacy call with any other refills you may need.  Please continue your efforts at being more active, low cholesterol diet, and weight control.  You are otherwise up to date with prevention measures today.  Please keep your appointments with your specialists as you may have planned   

## 2015-04-03 NOTE — Assessment & Plan Note (Signed)
D/w pt his current med list and the fact he is not actually taking the fenofibrate apparently, and is confused with the name of the crestor brand vs generic name.  Pt has been a Warden/ranger prior to retirement, which he reminds me several times, and makes disparaging remarks regarding our performance in communication with him.  I explained to pt all meds will be resent to his pharmacy, and he will leave today (as he did last visit) with a current list.  He seemed to be satisfied with this.

## 2015-04-04 ENCOUNTER — Telehealth: Payer: Self-pay | Admitting: Internal Medicine

## 2015-04-04 NOTE — Telephone Encounter (Signed)
Patient dismissed from [facility] by Cathlean Cower MD , effective April 03, 2015. Dismissal letter sent out by certified / registered mail.  DAJ  Received signed domestic return receipt verifying delivery of certified letter on April 17, 2015. Article number [id number]

## 2015-04-17 DIAGNOSIS — M9903 Segmental and somatic dysfunction of lumbar region: Secondary | ICD-10-CM | POA: Diagnosis not present

## 2015-04-17 DIAGNOSIS — M9902 Segmental and somatic dysfunction of thoracic region: Secondary | ICD-10-CM | POA: Diagnosis not present

## 2015-04-17 DIAGNOSIS — M9905 Segmental and somatic dysfunction of pelvic region: Secondary | ICD-10-CM | POA: Diagnosis not present

## 2015-04-17 DIAGNOSIS — M47817 Spondylosis without myelopathy or radiculopathy, lumbosacral region: Secondary | ICD-10-CM | POA: Diagnosis not present

## 2015-04-17 DIAGNOSIS — S29012A Strain of muscle and tendon of back wall of thorax, initial encounter: Secondary | ICD-10-CM | POA: Diagnosis not present

## 2015-04-17 DIAGNOSIS — S39012A Strain of muscle, fascia and tendon of lower back, initial encounter: Secondary | ICD-10-CM | POA: Diagnosis not present

## 2015-04-23 DIAGNOSIS — M47817 Spondylosis without myelopathy or radiculopathy, lumbosacral region: Secondary | ICD-10-CM | POA: Diagnosis not present

## 2015-04-23 DIAGNOSIS — M9903 Segmental and somatic dysfunction of lumbar region: Secondary | ICD-10-CM | POA: Diagnosis not present

## 2015-04-23 DIAGNOSIS — M9905 Segmental and somatic dysfunction of pelvic region: Secondary | ICD-10-CM | POA: Diagnosis not present

## 2015-04-23 DIAGNOSIS — C61 Malignant neoplasm of prostate: Secondary | ICD-10-CM | POA: Diagnosis not present

## 2015-04-23 DIAGNOSIS — S39012A Strain of muscle, fascia and tendon of lower back, initial encounter: Secondary | ICD-10-CM | POA: Diagnosis not present

## 2015-04-23 DIAGNOSIS — M9902 Segmental and somatic dysfunction of thoracic region: Secondary | ICD-10-CM | POA: Diagnosis not present

## 2015-04-23 DIAGNOSIS — S29012A Strain of muscle and tendon of back wall of thorax, initial encounter: Secondary | ICD-10-CM | POA: Diagnosis not present

## 2015-04-25 ENCOUNTER — Other Ambulatory Visit: Payer: Self-pay | Admitting: Internal Medicine

## 2015-05-01 ENCOUNTER — Other Ambulatory Visit: Payer: Self-pay | Admitting: Internal Medicine

## 2015-05-01 DIAGNOSIS — N401 Enlarged prostate with lower urinary tract symptoms: Secondary | ICD-10-CM | POA: Diagnosis not present

## 2015-05-01 DIAGNOSIS — C61 Malignant neoplasm of prostate: Secondary | ICD-10-CM | POA: Diagnosis not present

## 2015-05-01 DIAGNOSIS — N5201 Erectile dysfunction due to arterial insufficiency: Secondary | ICD-10-CM | POA: Diagnosis not present

## 2015-05-01 DIAGNOSIS — R350 Frequency of micturition: Secondary | ICD-10-CM | POA: Diagnosis not present

## 2015-05-01 DIAGNOSIS — R3916 Straining to void: Secondary | ICD-10-CM | POA: Diagnosis not present

## 2015-05-06 DIAGNOSIS — H353211 Exudative age-related macular degeneration, right eye, with active choroidal neovascularization: Secondary | ICD-10-CM | POA: Diagnosis not present

## 2015-05-23 DIAGNOSIS — M9902 Segmental and somatic dysfunction of thoracic region: Secondary | ICD-10-CM | POA: Diagnosis not present

## 2015-05-23 DIAGNOSIS — M9903 Segmental and somatic dysfunction of lumbar region: Secondary | ICD-10-CM | POA: Diagnosis not present

## 2015-05-23 DIAGNOSIS — S39012A Strain of muscle, fascia and tendon of lower back, initial encounter: Secondary | ICD-10-CM | POA: Diagnosis not present

## 2015-05-23 DIAGNOSIS — M47817 Spondylosis without myelopathy or radiculopathy, lumbosacral region: Secondary | ICD-10-CM | POA: Diagnosis not present

## 2015-05-23 DIAGNOSIS — M9905 Segmental and somatic dysfunction of pelvic region: Secondary | ICD-10-CM | POA: Diagnosis not present

## 2015-05-23 DIAGNOSIS — S29012A Strain of muscle and tendon of back wall of thorax, initial encounter: Secondary | ICD-10-CM | POA: Diagnosis not present

## 2015-05-27 ENCOUNTER — Other Ambulatory Visit: Payer: Self-pay | Admitting: Internal Medicine

## 2015-06-13 DIAGNOSIS — E139 Other specified diabetes mellitus without complications: Secondary | ICD-10-CM | POA: Diagnosis not present

## 2015-06-13 DIAGNOSIS — I10 Essential (primary) hypertension: Secondary | ICD-10-CM | POA: Diagnosis not present

## 2015-06-13 DIAGNOSIS — E785 Hyperlipidemia, unspecified: Secondary | ICD-10-CM | POA: Diagnosis not present

## 2015-06-14 DIAGNOSIS — E785 Hyperlipidemia, unspecified: Secondary | ICD-10-CM | POA: Diagnosis not present

## 2015-06-14 DIAGNOSIS — E139 Other specified diabetes mellitus without complications: Secondary | ICD-10-CM | POA: Diagnosis not present

## 2015-09-04 ENCOUNTER — Ambulatory Visit: Payer: Medicare Other | Admitting: Internal Medicine

## 2015-09-09 DIAGNOSIS — H4421 Degenerative myopia, right eye: Secondary | ICD-10-CM | POA: Diagnosis not present

## 2015-09-09 DIAGNOSIS — H2512 Age-related nuclear cataract, left eye: Secondary | ICD-10-CM | POA: Diagnosis not present

## 2015-09-09 DIAGNOSIS — H353211 Exudative age-related macular degeneration, right eye, with active choroidal neovascularization: Secondary | ICD-10-CM | POA: Diagnosis not present

## 2015-09-09 DIAGNOSIS — H3561 Retinal hemorrhage, right eye: Secondary | ICD-10-CM | POA: Diagnosis not present

## 2015-09-09 DIAGNOSIS — H2511 Age-related nuclear cataract, right eye: Secondary | ICD-10-CM | POA: Diagnosis not present

## 2015-10-02 DIAGNOSIS — H43813 Vitreous degeneration, bilateral: Secondary | ICD-10-CM | POA: Diagnosis not present

## 2015-10-02 DIAGNOSIS — H353134 Nonexudative age-related macular degeneration, bilateral, advanced atrophic with subfoveal involvement: Secondary | ICD-10-CM | POA: Diagnosis not present

## 2015-10-02 DIAGNOSIS — H2513 Age-related nuclear cataract, bilateral: Secondary | ICD-10-CM | POA: Diagnosis not present

## 2015-10-02 DIAGNOSIS — H40013 Open angle with borderline findings, low risk, bilateral: Secondary | ICD-10-CM | POA: Diagnosis not present

## 2015-11-20 DIAGNOSIS — C61 Malignant neoplasm of prostate: Secondary | ICD-10-CM | POA: Diagnosis not present

## 2015-11-26 DIAGNOSIS — Z8546 Personal history of malignant neoplasm of prostate: Secondary | ICD-10-CM | POA: Diagnosis not present

## 2016-01-09 DIAGNOSIS — M79671 Pain in right foot: Secondary | ICD-10-CM | POA: Diagnosis not present

## 2016-01-20 DIAGNOSIS — M79671 Pain in right foot: Secondary | ICD-10-CM | POA: Diagnosis not present

## 2016-01-28 DIAGNOSIS — M722 Plantar fascial fibromatosis: Secondary | ICD-10-CM | POA: Diagnosis not present

## 2016-01-29 DIAGNOSIS — H35051 Retinal neovascularization, unspecified, right eye: Secondary | ICD-10-CM | POA: Diagnosis not present

## 2016-01-29 DIAGNOSIS — H4421 Degenerative myopia, right eye: Secondary | ICD-10-CM | POA: Diagnosis not present

## 2016-01-29 DIAGNOSIS — H353211 Exudative age-related macular degeneration, right eye, with active choroidal neovascularization: Secondary | ICD-10-CM | POA: Diagnosis not present

## 2016-01-29 DIAGNOSIS — H25041 Posterior subcapsular polar age-related cataract, right eye: Secondary | ICD-10-CM | POA: Diagnosis not present

## 2016-02-05 DIAGNOSIS — M791 Myalgia: Secondary | ICD-10-CM | POA: Diagnosis not present

## 2016-02-05 DIAGNOSIS — S39012A Strain of muscle, fascia and tendon of lower back, initial encounter: Secondary | ICD-10-CM | POA: Diagnosis not present

## 2016-02-05 DIAGNOSIS — M9903 Segmental and somatic dysfunction of lumbar region: Secondary | ICD-10-CM | POA: Diagnosis not present

## 2016-02-05 DIAGNOSIS — M722 Plantar fascial fibromatosis: Secondary | ICD-10-CM | POA: Diagnosis not present

## 2016-02-05 DIAGNOSIS — M9905 Segmental and somatic dysfunction of pelvic region: Secondary | ICD-10-CM | POA: Diagnosis not present

## 2016-02-05 DIAGNOSIS — M9906 Segmental and somatic dysfunction of lower extremity: Secondary | ICD-10-CM | POA: Diagnosis not present

## 2016-02-05 DIAGNOSIS — M47817 Spondylosis without myelopathy or radiculopathy, lumbosacral region: Secondary | ICD-10-CM | POA: Diagnosis not present

## 2016-02-05 DIAGNOSIS — S29012A Strain of muscle and tendon of back wall of thorax, initial encounter: Secondary | ICD-10-CM | POA: Diagnosis not present

## 2016-02-05 DIAGNOSIS — M9902 Segmental and somatic dysfunction of thoracic region: Secondary | ICD-10-CM | POA: Diagnosis not present

## 2016-02-10 DIAGNOSIS — M791 Myalgia: Secondary | ICD-10-CM | POA: Diagnosis not present

## 2016-02-10 DIAGNOSIS — M9905 Segmental and somatic dysfunction of pelvic region: Secondary | ICD-10-CM | POA: Diagnosis not present

## 2016-02-10 DIAGNOSIS — M722 Plantar fascial fibromatosis: Secondary | ICD-10-CM | POA: Diagnosis not present

## 2016-02-10 DIAGNOSIS — M9903 Segmental and somatic dysfunction of lumbar region: Secondary | ICD-10-CM | POA: Diagnosis not present

## 2016-02-10 DIAGNOSIS — M9906 Segmental and somatic dysfunction of lower extremity: Secondary | ICD-10-CM | POA: Diagnosis not present

## 2016-02-12 DIAGNOSIS — M9906 Segmental and somatic dysfunction of lower extremity: Secondary | ICD-10-CM | POA: Diagnosis not present

## 2016-02-12 DIAGNOSIS — M9905 Segmental and somatic dysfunction of pelvic region: Secondary | ICD-10-CM | POA: Diagnosis not present

## 2016-02-12 DIAGNOSIS — M722 Plantar fascial fibromatosis: Secondary | ICD-10-CM | POA: Diagnosis not present

## 2016-02-12 DIAGNOSIS — M9903 Segmental and somatic dysfunction of lumbar region: Secondary | ICD-10-CM | POA: Diagnosis not present

## 2016-02-12 DIAGNOSIS — M791 Myalgia: Secondary | ICD-10-CM | POA: Diagnosis not present

## 2016-02-13 DIAGNOSIS — E785 Hyperlipidemia, unspecified: Secondary | ICD-10-CM | POA: Diagnosis not present

## 2016-02-13 DIAGNOSIS — I10 Essential (primary) hypertension: Secondary | ICD-10-CM | POA: Diagnosis not present

## 2016-02-13 DIAGNOSIS — E109 Type 1 diabetes mellitus without complications: Secondary | ICD-10-CM | POA: Diagnosis not present

## 2016-02-18 DIAGNOSIS — M791 Myalgia: Secondary | ICD-10-CM | POA: Diagnosis not present

## 2016-02-18 DIAGNOSIS — M9906 Segmental and somatic dysfunction of lower extremity: Secondary | ICD-10-CM | POA: Diagnosis not present

## 2016-02-18 DIAGNOSIS — M9903 Segmental and somatic dysfunction of lumbar region: Secondary | ICD-10-CM | POA: Diagnosis not present

## 2016-02-18 DIAGNOSIS — M9905 Segmental and somatic dysfunction of pelvic region: Secondary | ICD-10-CM | POA: Diagnosis not present

## 2016-02-18 DIAGNOSIS — M722 Plantar fascial fibromatosis: Secondary | ICD-10-CM | POA: Diagnosis not present

## 2016-02-20 DIAGNOSIS — M722 Plantar fascial fibromatosis: Secondary | ICD-10-CM | POA: Diagnosis not present

## 2016-02-20 DIAGNOSIS — M9906 Segmental and somatic dysfunction of lower extremity: Secondary | ICD-10-CM | POA: Diagnosis not present

## 2016-02-20 DIAGNOSIS — M791 Myalgia: Secondary | ICD-10-CM | POA: Diagnosis not present

## 2016-02-20 DIAGNOSIS — M9905 Segmental and somatic dysfunction of pelvic region: Secondary | ICD-10-CM | POA: Diagnosis not present

## 2016-02-20 DIAGNOSIS — M9903 Segmental and somatic dysfunction of lumbar region: Secondary | ICD-10-CM | POA: Diagnosis not present

## 2016-02-24 DIAGNOSIS — M9905 Segmental and somatic dysfunction of pelvic region: Secondary | ICD-10-CM | POA: Diagnosis not present

## 2016-02-24 DIAGNOSIS — M9903 Segmental and somatic dysfunction of lumbar region: Secondary | ICD-10-CM | POA: Diagnosis not present

## 2016-02-24 DIAGNOSIS — M9906 Segmental and somatic dysfunction of lower extremity: Secondary | ICD-10-CM | POA: Diagnosis not present

## 2016-02-24 DIAGNOSIS — M791 Myalgia: Secondary | ICD-10-CM | POA: Diagnosis not present

## 2016-02-24 DIAGNOSIS — M722 Plantar fascial fibromatosis: Secondary | ICD-10-CM | POA: Diagnosis not present

## 2016-02-26 DIAGNOSIS — M791 Myalgia: Secondary | ICD-10-CM | POA: Diagnosis not present

## 2016-02-26 DIAGNOSIS — M9906 Segmental and somatic dysfunction of lower extremity: Secondary | ICD-10-CM | POA: Diagnosis not present

## 2016-02-26 DIAGNOSIS — M722 Plantar fascial fibromatosis: Secondary | ICD-10-CM | POA: Diagnosis not present

## 2016-02-26 DIAGNOSIS — M9903 Segmental and somatic dysfunction of lumbar region: Secondary | ICD-10-CM | POA: Diagnosis not present

## 2016-02-26 DIAGNOSIS — M9905 Segmental and somatic dysfunction of pelvic region: Secondary | ICD-10-CM | POA: Diagnosis not present

## 2016-03-03 DIAGNOSIS — M9906 Segmental and somatic dysfunction of lower extremity: Secondary | ICD-10-CM | POA: Diagnosis not present

## 2016-03-03 DIAGNOSIS — M722 Plantar fascial fibromatosis: Secondary | ICD-10-CM | POA: Diagnosis not present

## 2016-03-03 DIAGNOSIS — M9905 Segmental and somatic dysfunction of pelvic region: Secondary | ICD-10-CM | POA: Diagnosis not present

## 2016-03-03 DIAGNOSIS — M791 Myalgia: Secondary | ICD-10-CM | POA: Diagnosis not present

## 2016-03-03 DIAGNOSIS — M9903 Segmental and somatic dysfunction of lumbar region: Secondary | ICD-10-CM | POA: Diagnosis not present

## 2016-03-04 DIAGNOSIS — S29012A Strain of muscle and tendon of back wall of thorax, initial encounter: Secondary | ICD-10-CM | POA: Diagnosis not present

## 2016-03-04 DIAGNOSIS — M9905 Segmental and somatic dysfunction of pelvic region: Secondary | ICD-10-CM | POA: Diagnosis not present

## 2016-03-04 DIAGNOSIS — M47817 Spondylosis without myelopathy or radiculopathy, lumbosacral region: Secondary | ICD-10-CM | POA: Diagnosis not present

## 2016-03-04 DIAGNOSIS — M9902 Segmental and somatic dysfunction of thoracic region: Secondary | ICD-10-CM | POA: Diagnosis not present

## 2016-03-04 DIAGNOSIS — M9903 Segmental and somatic dysfunction of lumbar region: Secondary | ICD-10-CM | POA: Diagnosis not present

## 2016-03-04 DIAGNOSIS — S39012A Strain of muscle, fascia and tendon of lower back, initial encounter: Secondary | ICD-10-CM | POA: Diagnosis not present

## 2016-03-05 DIAGNOSIS — M9906 Segmental and somatic dysfunction of lower extremity: Secondary | ICD-10-CM | POA: Diagnosis not present

## 2016-03-05 DIAGNOSIS — M9903 Segmental and somatic dysfunction of lumbar region: Secondary | ICD-10-CM | POA: Diagnosis not present

## 2016-03-05 DIAGNOSIS — M9905 Segmental and somatic dysfunction of pelvic region: Secondary | ICD-10-CM | POA: Diagnosis not present

## 2016-03-05 DIAGNOSIS — M722 Plantar fascial fibromatosis: Secondary | ICD-10-CM | POA: Diagnosis not present

## 2016-03-05 DIAGNOSIS — M791 Myalgia: Secondary | ICD-10-CM | POA: Diagnosis not present

## 2016-03-09 DIAGNOSIS — M722 Plantar fascial fibromatosis: Secondary | ICD-10-CM | POA: Diagnosis not present

## 2016-03-09 DIAGNOSIS — M791 Myalgia: Secondary | ICD-10-CM | POA: Diagnosis not present

## 2016-03-09 DIAGNOSIS — M9906 Segmental and somatic dysfunction of lower extremity: Secondary | ICD-10-CM | POA: Diagnosis not present

## 2016-03-09 DIAGNOSIS — M9903 Segmental and somatic dysfunction of lumbar region: Secondary | ICD-10-CM | POA: Diagnosis not present

## 2016-03-09 DIAGNOSIS — M9905 Segmental and somatic dysfunction of pelvic region: Secondary | ICD-10-CM | POA: Diagnosis not present

## 2016-03-16 DIAGNOSIS — M722 Plantar fascial fibromatosis: Secondary | ICD-10-CM | POA: Diagnosis not present

## 2016-03-16 DIAGNOSIS — M9905 Segmental and somatic dysfunction of pelvic region: Secondary | ICD-10-CM | POA: Diagnosis not present

## 2016-03-16 DIAGNOSIS — M9903 Segmental and somatic dysfunction of lumbar region: Secondary | ICD-10-CM | POA: Diagnosis not present

## 2016-03-16 DIAGNOSIS — M9906 Segmental and somatic dysfunction of lower extremity: Secondary | ICD-10-CM | POA: Diagnosis not present

## 2016-03-31 DIAGNOSIS — H353113 Nonexudative age-related macular degeneration, right eye, advanced atrophic without subfoveal involvement: Secondary | ICD-10-CM | POA: Diagnosis not present

## 2016-03-31 DIAGNOSIS — H40013 Open angle with borderline findings, low risk, bilateral: Secondary | ICD-10-CM | POA: Diagnosis not present

## 2016-03-31 DIAGNOSIS — H43813 Vitreous degeneration, bilateral: Secondary | ICD-10-CM | POA: Diagnosis not present

## 2016-03-31 DIAGNOSIS — H2513 Age-related nuclear cataract, bilateral: Secondary | ICD-10-CM | POA: Diagnosis not present

## 2016-03-31 DIAGNOSIS — H353222 Exudative age-related macular degeneration, left eye, with inactive choroidal neovascularization: Secondary | ICD-10-CM | POA: Diagnosis not present

## 2016-04-13 DIAGNOSIS — M9903 Segmental and somatic dysfunction of lumbar region: Secondary | ICD-10-CM | POA: Diagnosis not present

## 2016-04-13 DIAGNOSIS — M722 Plantar fascial fibromatosis: Secondary | ICD-10-CM | POA: Diagnosis not present

## 2016-04-13 DIAGNOSIS — M9906 Segmental and somatic dysfunction of lower extremity: Secondary | ICD-10-CM | POA: Diagnosis not present

## 2016-04-13 DIAGNOSIS — M9905 Segmental and somatic dysfunction of pelvic region: Secondary | ICD-10-CM | POA: Diagnosis not present

## 2016-04-20 DIAGNOSIS — M722 Plantar fascial fibromatosis: Secondary | ICD-10-CM | POA: Diagnosis not present

## 2016-04-20 DIAGNOSIS — M9903 Segmental and somatic dysfunction of lumbar region: Secondary | ICD-10-CM | POA: Diagnosis not present

## 2016-04-20 DIAGNOSIS — M9906 Segmental and somatic dysfunction of lower extremity: Secondary | ICD-10-CM | POA: Diagnosis not present

## 2016-04-20 DIAGNOSIS — M9905 Segmental and somatic dysfunction of pelvic region: Secondary | ICD-10-CM | POA: Diagnosis not present

## 2016-04-27 DIAGNOSIS — M9905 Segmental and somatic dysfunction of pelvic region: Secondary | ICD-10-CM | POA: Diagnosis not present

## 2016-04-27 DIAGNOSIS — M9903 Segmental and somatic dysfunction of lumbar region: Secondary | ICD-10-CM | POA: Diagnosis not present

## 2016-05-04 DIAGNOSIS — M47817 Spondylosis without myelopathy or radiculopathy, lumbosacral region: Secondary | ICD-10-CM | POA: Diagnosis not present

## 2016-05-04 DIAGNOSIS — M9905 Segmental and somatic dysfunction of pelvic region: Secondary | ICD-10-CM | POA: Diagnosis not present

## 2016-05-04 DIAGNOSIS — M9902 Segmental and somatic dysfunction of thoracic region: Secondary | ICD-10-CM | POA: Diagnosis not present

## 2016-05-04 DIAGNOSIS — S29012A Strain of muscle and tendon of back wall of thorax, initial encounter: Secondary | ICD-10-CM | POA: Diagnosis not present

## 2016-05-04 DIAGNOSIS — M9903 Segmental and somatic dysfunction of lumbar region: Secondary | ICD-10-CM | POA: Diagnosis not present

## 2016-05-04 DIAGNOSIS — S39012A Strain of muscle, fascia and tendon of lower back, initial encounter: Secondary | ICD-10-CM | POA: Diagnosis not present

## 2016-05-25 DIAGNOSIS — Z8546 Personal history of malignant neoplasm of prostate: Secondary | ICD-10-CM | POA: Diagnosis not present

## 2016-05-29 DIAGNOSIS — Z7689 Persons encountering health services in other specified circumstances: Secondary | ICD-10-CM | POA: Diagnosis not present

## 2016-05-29 DIAGNOSIS — E119 Type 2 diabetes mellitus without complications: Secondary | ICD-10-CM | POA: Diagnosis not present

## 2016-05-29 DIAGNOSIS — E785 Hyperlipidemia, unspecified: Secondary | ICD-10-CM | POA: Diagnosis not present

## 2016-05-29 DIAGNOSIS — I10 Essential (primary) hypertension: Secondary | ICD-10-CM | POA: Diagnosis not present

## 2016-06-01 DIAGNOSIS — M9905 Segmental and somatic dysfunction of pelvic region: Secondary | ICD-10-CM | POA: Diagnosis not present

## 2016-06-01 DIAGNOSIS — M9902 Segmental and somatic dysfunction of thoracic region: Secondary | ICD-10-CM | POA: Diagnosis not present

## 2016-06-01 DIAGNOSIS — S39012A Strain of muscle, fascia and tendon of lower back, initial encounter: Secondary | ICD-10-CM | POA: Diagnosis not present

## 2016-06-01 DIAGNOSIS — M9903 Segmental and somatic dysfunction of lumbar region: Secondary | ICD-10-CM | POA: Diagnosis not present

## 2016-06-01 DIAGNOSIS — S29012A Strain of muscle and tendon of back wall of thorax, initial encounter: Secondary | ICD-10-CM | POA: Diagnosis not present

## 2016-06-01 DIAGNOSIS — M47817 Spondylosis without myelopathy or radiculopathy, lumbosacral region: Secondary | ICD-10-CM | POA: Diagnosis not present

## 2016-06-19 DIAGNOSIS — R945 Abnormal results of liver function studies: Secondary | ICD-10-CM | POA: Diagnosis not present

## 2016-06-19 DIAGNOSIS — R160 Hepatomegaly, not elsewhere classified: Secondary | ICD-10-CM | POA: Diagnosis not present

## 2016-06-19 DIAGNOSIS — R7989 Other specified abnormal findings of blood chemistry: Secondary | ICD-10-CM | POA: Diagnosis not present

## 2016-06-19 DIAGNOSIS — K760 Fatty (change of) liver, not elsewhere classified: Secondary | ICD-10-CM | POA: Diagnosis not present

## 2016-06-23 DIAGNOSIS — N5201 Erectile dysfunction due to arterial insufficiency: Secondary | ICD-10-CM | POA: Diagnosis not present

## 2016-06-23 DIAGNOSIS — Z8546 Personal history of malignant neoplasm of prostate: Secondary | ICD-10-CM | POA: Diagnosis not present

## 2016-06-23 DIAGNOSIS — R351 Nocturia: Secondary | ICD-10-CM | POA: Diagnosis not present

## 2016-06-30 DIAGNOSIS — M9902 Segmental and somatic dysfunction of thoracic region: Secondary | ICD-10-CM | POA: Diagnosis not present

## 2016-06-30 DIAGNOSIS — M9903 Segmental and somatic dysfunction of lumbar region: Secondary | ICD-10-CM | POA: Diagnosis not present

## 2016-06-30 DIAGNOSIS — M9905 Segmental and somatic dysfunction of pelvic region: Secondary | ICD-10-CM | POA: Diagnosis not present

## 2016-06-30 DIAGNOSIS — S39012A Strain of muscle, fascia and tendon of lower back, initial encounter: Secondary | ICD-10-CM | POA: Diagnosis not present

## 2016-06-30 DIAGNOSIS — S29012A Strain of muscle and tendon of back wall of thorax, initial encounter: Secondary | ICD-10-CM | POA: Diagnosis not present

## 2016-06-30 DIAGNOSIS — M47817 Spondylosis without myelopathy or radiculopathy, lumbosacral region: Secondary | ICD-10-CM | POA: Diagnosis not present

## 2016-07-12 DIAGNOSIS — K760 Fatty (change of) liver, not elsewhere classified: Secondary | ICD-10-CM | POA: Diagnosis not present

## 2016-07-12 DIAGNOSIS — K862 Cyst of pancreas: Secondary | ICD-10-CM | POA: Diagnosis not present

## 2016-07-12 DIAGNOSIS — R161 Splenomegaly, not elsewhere classified: Secondary | ICD-10-CM | POA: Diagnosis not present

## 2016-07-12 DIAGNOSIS — D490 Neoplasm of unspecified behavior of digestive system: Secondary | ICD-10-CM | POA: Diagnosis not present

## 2016-07-29 DIAGNOSIS — H353211 Exudative age-related macular degeneration, right eye, with active choroidal neovascularization: Secondary | ICD-10-CM | POA: Diagnosis not present

## 2016-07-29 DIAGNOSIS — H4421 Degenerative myopia, right eye: Secondary | ICD-10-CM | POA: Diagnosis not present

## 2016-07-29 DIAGNOSIS — H4422 Degenerative myopia, left eye: Secondary | ICD-10-CM | POA: Diagnosis not present

## 2016-07-29 DIAGNOSIS — H25041 Posterior subcapsular polar age-related cataract, right eye: Secondary | ICD-10-CM | POA: Diagnosis not present

## 2016-07-29 DIAGNOSIS — H2512 Age-related nuclear cataract, left eye: Secondary | ICD-10-CM | POA: Diagnosis not present

## 2016-09-07 DIAGNOSIS — M9903 Segmental and somatic dysfunction of lumbar region: Secondary | ICD-10-CM | POA: Diagnosis not present

## 2016-09-07 DIAGNOSIS — M9905 Segmental and somatic dysfunction of pelvic region: Secondary | ICD-10-CM | POA: Diagnosis not present

## 2016-09-07 DIAGNOSIS — M9902 Segmental and somatic dysfunction of thoracic region: Secondary | ICD-10-CM | POA: Diagnosis not present

## 2016-09-07 DIAGNOSIS — S29012A Strain of muscle and tendon of back wall of thorax, initial encounter: Secondary | ICD-10-CM | POA: Diagnosis not present

## 2016-09-07 DIAGNOSIS — M47817 Spondylosis without myelopathy or radiculopathy, lumbosacral region: Secondary | ICD-10-CM | POA: Diagnosis not present

## 2016-09-07 DIAGNOSIS — S39012A Strain of muscle, fascia and tendon of lower back, initial encounter: Secondary | ICD-10-CM | POA: Diagnosis not present

## 2016-09-09 DIAGNOSIS — K862 Cyst of pancreas: Secondary | ICD-10-CM | POA: Diagnosis not present

## 2016-09-09 DIAGNOSIS — R7989 Other specified abnormal findings of blood chemistry: Secondary | ICD-10-CM | POA: Diagnosis not present

## 2016-10-20 DIAGNOSIS — M9903 Segmental and somatic dysfunction of lumbar region: Secondary | ICD-10-CM | POA: Diagnosis not present

## 2016-10-20 DIAGNOSIS — S39012A Strain of muscle, fascia and tendon of lower back, initial encounter: Secondary | ICD-10-CM | POA: Diagnosis not present

## 2016-10-20 DIAGNOSIS — M9902 Segmental and somatic dysfunction of thoracic region: Secondary | ICD-10-CM | POA: Diagnosis not present

## 2016-10-20 DIAGNOSIS — M9905 Segmental and somatic dysfunction of pelvic region: Secondary | ICD-10-CM | POA: Diagnosis not present

## 2016-10-20 DIAGNOSIS — M47817 Spondylosis without myelopathy or radiculopathy, lumbosacral region: Secondary | ICD-10-CM | POA: Diagnosis not present

## 2016-10-20 DIAGNOSIS — S29012A Strain of muscle and tendon of back wall of thorax, initial encounter: Secondary | ICD-10-CM | POA: Diagnosis not present

## 2016-10-28 DIAGNOSIS — H40013 Open angle with borderline findings, low risk, bilateral: Secondary | ICD-10-CM | POA: Diagnosis not present

## 2016-10-28 DIAGNOSIS — H353123 Nonexudative age-related macular degeneration, left eye, advanced atrophic without subfoveal involvement: Secondary | ICD-10-CM | POA: Diagnosis not present

## 2016-10-28 DIAGNOSIS — H2513 Age-related nuclear cataract, bilateral: Secondary | ICD-10-CM | POA: Diagnosis not present

## 2016-11-11 DIAGNOSIS — H4423 Degenerative myopia, bilateral: Secondary | ICD-10-CM | POA: Diagnosis not present

## 2016-11-11 DIAGNOSIS — H25813 Combined forms of age-related cataract, bilateral: Secondary | ICD-10-CM | POA: Diagnosis not present

## 2016-11-11 DIAGNOSIS — H31093 Other chorioretinal scars, bilateral: Secondary | ICD-10-CM | POA: Diagnosis not present

## 2016-12-03 DIAGNOSIS — R945 Abnormal results of liver function studies: Secondary | ICD-10-CM | POA: Diagnosis not present

## 2016-12-03 DIAGNOSIS — E782 Mixed hyperlipidemia: Secondary | ICD-10-CM | POA: Diagnosis not present

## 2016-12-03 DIAGNOSIS — E119 Type 2 diabetes mellitus without complications: Secondary | ICD-10-CM | POA: Diagnosis not present

## 2016-12-03 DIAGNOSIS — I10 Essential (primary) hypertension: Secondary | ICD-10-CM | POA: Diagnosis not present

## 2016-12-21 DIAGNOSIS — H2512 Age-related nuclear cataract, left eye: Secondary | ICD-10-CM | POA: Diagnosis not present

## 2016-12-21 DIAGNOSIS — H25812 Combined forms of age-related cataract, left eye: Secondary | ICD-10-CM | POA: Diagnosis not present

## 2017-01-15 DIAGNOSIS — H2511 Age-related nuclear cataract, right eye: Secondary | ICD-10-CM | POA: Diagnosis not present

## 2017-01-25 DIAGNOSIS — H2511 Age-related nuclear cataract, right eye: Secondary | ICD-10-CM | POA: Diagnosis not present

## 2017-01-25 DIAGNOSIS — H25811 Combined forms of age-related cataract, right eye: Secondary | ICD-10-CM | POA: Diagnosis not present

## 2017-01-27 DIAGNOSIS — Z8546 Personal history of malignant neoplasm of prostate: Secondary | ICD-10-CM | POA: Diagnosis not present

## 2017-02-01 DIAGNOSIS — M9903 Segmental and somatic dysfunction of lumbar region: Secondary | ICD-10-CM | POA: Diagnosis not present

## 2017-02-01 DIAGNOSIS — M47817 Spondylosis without myelopathy or radiculopathy, lumbosacral region: Secondary | ICD-10-CM | POA: Diagnosis not present

## 2017-02-01 DIAGNOSIS — S39012A Strain of muscle, fascia and tendon of lower back, initial encounter: Secondary | ICD-10-CM | POA: Diagnosis not present

## 2017-02-01 DIAGNOSIS — S29012A Strain of muscle and tendon of back wall of thorax, initial encounter: Secondary | ICD-10-CM | POA: Diagnosis not present

## 2017-02-01 DIAGNOSIS — M9905 Segmental and somatic dysfunction of pelvic region: Secondary | ICD-10-CM | POA: Diagnosis not present

## 2017-02-01 DIAGNOSIS — M9902 Segmental and somatic dysfunction of thoracic region: Secondary | ICD-10-CM | POA: Diagnosis not present

## 2017-02-03 DIAGNOSIS — Z8546 Personal history of malignant neoplasm of prostate: Secondary | ICD-10-CM | POA: Diagnosis not present

## 2017-02-03 DIAGNOSIS — N401 Enlarged prostate with lower urinary tract symptoms: Secondary | ICD-10-CM | POA: Diagnosis not present

## 2017-02-03 DIAGNOSIS — R351 Nocturia: Secondary | ICD-10-CM | POA: Diagnosis not present

## 2017-02-12 DIAGNOSIS — E119 Type 2 diabetes mellitus without complications: Secondary | ICD-10-CM | POA: Diagnosis not present

## 2017-02-16 DIAGNOSIS — Z7984 Long term (current) use of oral hypoglycemic drugs: Secondary | ICD-10-CM | POA: Diagnosis not present

## 2017-02-16 DIAGNOSIS — M79646 Pain in unspecified finger(s): Secondary | ICD-10-CM | POA: Diagnosis not present

## 2017-02-16 DIAGNOSIS — Z1389 Encounter for screening for other disorder: Secondary | ICD-10-CM | POA: Diagnosis not present

## 2017-02-24 DIAGNOSIS — H353212 Exudative age-related macular degeneration, right eye, with inactive choroidal neovascularization: Secondary | ICD-10-CM | POA: Diagnosis not present

## 2017-02-24 DIAGNOSIS — H4421 Degenerative myopia, right eye: Secondary | ICD-10-CM | POA: Diagnosis not present

## 2017-02-24 DIAGNOSIS — H353124 Nonexudative age-related macular degeneration, left eye, advanced atrophic with subfoveal involvement: Secondary | ICD-10-CM | POA: Diagnosis not present

## 2017-02-24 DIAGNOSIS — H353113 Nonexudative age-related macular degeneration, right eye, advanced atrophic without subfoveal involvement: Secondary | ICD-10-CM | POA: Diagnosis not present

## 2017-04-09 DIAGNOSIS — A499 Bacterial infection, unspecified: Secondary | ICD-10-CM | POA: Diagnosis not present

## 2017-04-09 DIAGNOSIS — J069 Acute upper respiratory infection, unspecified: Secondary | ICD-10-CM | POA: Diagnosis not present

## 2017-04-09 DIAGNOSIS — N390 Urinary tract infection, site not specified: Secondary | ICD-10-CM | POA: Diagnosis not present

## 2017-04-12 DIAGNOSIS — N3000 Acute cystitis without hematuria: Secondary | ICD-10-CM | POA: Diagnosis not present

## 2017-04-20 DIAGNOSIS — E7800 Pure hypercholesterolemia, unspecified: Secondary | ICD-10-CM | POA: Diagnosis not present

## 2017-04-20 DIAGNOSIS — E1165 Type 2 diabetes mellitus with hyperglycemia: Secondary | ICD-10-CM | POA: Diagnosis not present

## 2017-04-20 DIAGNOSIS — Z131 Encounter for screening for diabetes mellitus: Secondary | ICD-10-CM | POA: Diagnosis not present

## 2017-04-20 DIAGNOSIS — I10 Essential (primary) hypertension: Secondary | ICD-10-CM | POA: Diagnosis not present

## 2017-04-29 DIAGNOSIS — E1165 Type 2 diabetes mellitus with hyperglycemia: Secondary | ICD-10-CM | POA: Diagnosis not present

## 2017-04-29 DIAGNOSIS — E781 Pure hyperglyceridemia: Secondary | ICD-10-CM | POA: Diagnosis not present

## 2017-04-29 DIAGNOSIS — R300 Dysuria: Secondary | ICD-10-CM | POA: Diagnosis not present

## 2017-04-29 DIAGNOSIS — I10 Essential (primary) hypertension: Secondary | ICD-10-CM | POA: Diagnosis not present

## 2017-04-29 DIAGNOSIS — A499 Bacterial infection, unspecified: Secondary | ICD-10-CM | POA: Diagnosis not present

## 2017-04-29 DIAGNOSIS — R7989 Other specified abnormal findings of blood chemistry: Secondary | ICD-10-CM | POA: Diagnosis not present

## 2017-04-29 DIAGNOSIS — N390 Urinary tract infection, site not specified: Secondary | ICD-10-CM | POA: Diagnosis not present

## 2017-05-24 DIAGNOSIS — A499 Bacterial infection, unspecified: Secondary | ICD-10-CM | POA: Diagnosis not present

## 2017-05-24 DIAGNOSIS — N390 Urinary tract infection, site not specified: Secondary | ICD-10-CM | POA: Diagnosis not present

## 2017-05-24 DIAGNOSIS — R300 Dysuria: Secondary | ICD-10-CM | POA: Diagnosis not present

## 2017-05-24 DIAGNOSIS — I10 Essential (primary) hypertension: Secondary | ICD-10-CM | POA: Diagnosis not present

## 2017-07-29 DIAGNOSIS — E1165 Type 2 diabetes mellitus with hyperglycemia: Secondary | ICD-10-CM | POA: Diagnosis not present

## 2017-07-29 DIAGNOSIS — E7800 Pure hypercholesterolemia, unspecified: Secondary | ICD-10-CM | POA: Diagnosis not present

## 2017-07-29 DIAGNOSIS — I10 Essential (primary) hypertension: Secondary | ICD-10-CM | POA: Diagnosis not present

## 2017-07-29 DIAGNOSIS — E559 Vitamin D deficiency, unspecified: Secondary | ICD-10-CM | POA: Diagnosis not present

## 2017-07-29 DIAGNOSIS — Z125 Encounter for screening for malignant neoplasm of prostate: Secondary | ICD-10-CM | POA: Diagnosis not present

## 2017-08-09 DIAGNOSIS — Z8546 Personal history of malignant neoplasm of prostate: Secondary | ICD-10-CM | POA: Diagnosis not present

## 2017-08-18 DIAGNOSIS — R8271 Bacteriuria: Secondary | ICD-10-CM | POA: Diagnosis not present

## 2017-08-18 DIAGNOSIS — Z8546 Personal history of malignant neoplasm of prostate: Secondary | ICD-10-CM | POA: Diagnosis not present

## 2017-08-18 DIAGNOSIS — R300 Dysuria: Secondary | ICD-10-CM | POA: Diagnosis not present

## 2017-09-08 DIAGNOSIS — H04123 Dry eye syndrome of bilateral lacrimal glands: Secondary | ICD-10-CM | POA: Diagnosis not present

## 2017-09-08 DIAGNOSIS — H40013 Open angle with borderline findings, low risk, bilateral: Secondary | ICD-10-CM | POA: Diagnosis not present

## 2017-09-08 DIAGNOSIS — H31093 Other chorioretinal scars, bilateral: Secondary | ICD-10-CM | POA: Diagnosis not present

## 2017-09-08 DIAGNOSIS — Z961 Presence of intraocular lens: Secondary | ICD-10-CM | POA: Diagnosis not present

## 2017-09-14 DIAGNOSIS — R8271 Bacteriuria: Secondary | ICD-10-CM | POA: Diagnosis not present

## 2017-09-16 DIAGNOSIS — K824 Cholesterolosis of gallbladder: Secondary | ICD-10-CM | POA: Diagnosis not present

## 2017-09-16 DIAGNOSIS — K862 Cyst of pancreas: Secondary | ICD-10-CM | POA: Diagnosis not present

## 2017-09-22 ENCOUNTER — Encounter: Payer: Self-pay | Admitting: Neurology

## 2017-09-22 DIAGNOSIS — E559 Vitamin D deficiency, unspecified: Secondary | ICD-10-CM | POA: Diagnosis not present

## 2017-09-22 DIAGNOSIS — R8290 Unspecified abnormal findings in urine: Secondary | ICD-10-CM | POA: Diagnosis not present

## 2017-09-22 DIAGNOSIS — R569 Unspecified convulsions: Secondary | ICD-10-CM | POA: Diagnosis not present

## 2017-09-22 DIAGNOSIS — E1165 Type 2 diabetes mellitus with hyperglycemia: Secondary | ICD-10-CM | POA: Diagnosis not present

## 2017-09-24 ENCOUNTER — Other Ambulatory Visit: Payer: Self-pay | Admitting: Physician Assistant

## 2017-09-24 DIAGNOSIS — R569 Unspecified convulsions: Secondary | ICD-10-CM

## 2017-09-28 ENCOUNTER — Ambulatory Visit
Admission: RE | Admit: 2017-09-28 | Discharge: 2017-09-28 | Disposition: A | Discharge status: Home / Self Care | Payer: Medicare Other | Source: Ambulatory Visit | Attending: Physician Assistant | Admitting: Physician Assistant

## 2017-09-28 DIAGNOSIS — R569 Unspecified convulsions: Secondary | ICD-10-CM

## 2017-09-28 MED ORDER — GADOBENATE DIMEGLUMINE 529 MG/ML IV SOLN
18.0000 mL | Freq: Once | INTRAVENOUS | Status: AC | PRN
  Administered 2017-09-28: 18 mL via INTRAVENOUS

## 2017-09-29 DIAGNOSIS — A499 Bacterial infection, unspecified: Secondary | ICD-10-CM | POA: Diagnosis not present

## 2017-09-29 DIAGNOSIS — N390 Urinary tract infection, site not specified: Secondary | ICD-10-CM | POA: Diagnosis not present

## 2017-09-29 DIAGNOSIS — E1165 Type 2 diabetes mellitus with hyperglycemia: Secondary | ICD-10-CM | POA: Diagnosis not present

## 2017-10-20 DIAGNOSIS — R8271 Bacteriuria: Secondary | ICD-10-CM | POA: Diagnosis not present

## 2017-12-09 ENCOUNTER — Ambulatory Visit: Payer: Medicare Other | Admitting: Neurology

## 2018-01-26 DIAGNOSIS — H04123 Dry eye syndrome of bilateral lacrimal glands: Secondary | ICD-10-CM | POA: Diagnosis not present

## 2018-01-26 DIAGNOSIS — H31093 Other chorioretinal scars, bilateral: Secondary | ICD-10-CM | POA: Diagnosis not present

## 2018-01-26 DIAGNOSIS — Z961 Presence of intraocular lens: Secondary | ICD-10-CM | POA: Diagnosis not present

## 2018-01-26 DIAGNOSIS — H40013 Open angle with borderline findings, low risk, bilateral: Secondary | ICD-10-CM | POA: Diagnosis not present

## 2018-01-26 DIAGNOSIS — H353134 Nonexudative age-related macular degeneration, bilateral, advanced atrophic with subfoveal involvement: Secondary | ICD-10-CM | POA: Diagnosis not present

## 2018-03-01 DIAGNOSIS — I10 Essential (primary) hypertension: Secondary | ICD-10-CM | POA: Diagnosis not present

## 2018-03-01 DIAGNOSIS — E781 Pure hyperglyceridemia: Secondary | ICD-10-CM | POA: Diagnosis not present

## 2018-03-01 DIAGNOSIS — Z0000 Encounter for general adult medical examination without abnormal findings: Secondary | ICD-10-CM | POA: Diagnosis not present

## 2018-03-01 DIAGNOSIS — E1165 Type 2 diabetes mellitus with hyperglycemia: Secondary | ICD-10-CM | POA: Diagnosis not present

## 2018-03-01 DIAGNOSIS — R5383 Other fatigue: Secondary | ICD-10-CM | POA: Diagnosis not present

## 2018-03-09 DIAGNOSIS — E119 Type 2 diabetes mellitus without complications: Secondary | ICD-10-CM | POA: Diagnosis not present

## 2018-03-09 DIAGNOSIS — H4421 Degenerative myopia, right eye: Secondary | ICD-10-CM | POA: Diagnosis not present

## 2018-03-09 DIAGNOSIS — H353124 Nonexudative age-related macular degeneration, left eye, advanced atrophic with subfoveal involvement: Secondary | ICD-10-CM | POA: Diagnosis not present

## 2018-03-09 DIAGNOSIS — H353212 Exudative age-related macular degeneration, right eye, with inactive choroidal neovascularization: Secondary | ICD-10-CM | POA: Diagnosis not present

## 2018-03-09 DIAGNOSIS — H353113 Nonexudative age-related macular degeneration, right eye, advanced atrophic without subfoveal involvement: Secondary | ICD-10-CM | POA: Diagnosis not present

## 2018-03-17 DIAGNOSIS — D539 Nutritional anemia, unspecified: Secondary | ICD-10-CM | POA: Diagnosis not present

## 2018-03-17 DIAGNOSIS — E1165 Type 2 diabetes mellitus with hyperglycemia: Secondary | ICD-10-CM | POA: Diagnosis not present

## 2018-03-17 DIAGNOSIS — E7800 Pure hypercholesterolemia, unspecified: Secondary | ICD-10-CM | POA: Diagnosis not present

## 2018-03-17 DIAGNOSIS — I10 Essential (primary) hypertension: Secondary | ICD-10-CM | POA: Diagnosis not present

## 2018-03-28 DIAGNOSIS — D638 Anemia in other chronic diseases classified elsewhere: Secondary | ICD-10-CM | POA: Diagnosis not present

## 2018-03-28 DIAGNOSIS — I10 Essential (primary) hypertension: Secondary | ICD-10-CM | POA: Diagnosis not present

## 2018-03-28 DIAGNOSIS — E1165 Type 2 diabetes mellitus with hyperglycemia: Secondary | ICD-10-CM | POA: Diagnosis not present

## 2018-03-30 DIAGNOSIS — C61 Malignant neoplasm of prostate: Secondary | ICD-10-CM | POA: Diagnosis not present

## 2018-04-19 DIAGNOSIS — I10 Essential (primary) hypertension: Secondary | ICD-10-CM | POA: Diagnosis not present

## 2018-04-19 DIAGNOSIS — E7800 Pure hypercholesterolemia, unspecified: Secondary | ICD-10-CM | POA: Diagnosis not present

## 2018-04-19 DIAGNOSIS — E1165 Type 2 diabetes mellitus with hyperglycemia: Secondary | ICD-10-CM | POA: Diagnosis not present

## 2018-05-08 DIAGNOSIS — I10 Essential (primary) hypertension: Secondary | ICD-10-CM | POA: Diagnosis not present

## 2018-05-08 DIAGNOSIS — J0101 Acute recurrent maxillary sinusitis: Secondary | ICD-10-CM | POA: Diagnosis not present

## 2018-05-08 DIAGNOSIS — R509 Fever, unspecified: Secondary | ICD-10-CM | POA: Diagnosis not present

## 2018-05-08 DIAGNOSIS — E119 Type 2 diabetes mellitus without complications: Secondary | ICD-10-CM | POA: Diagnosis not present

## 2019-09-07 ENCOUNTER — Ambulatory Visit (INDEPENDENT_AMBULATORY_CARE_PROVIDER_SITE_OTHER): Payer: Medicare Other | Admitting: Ophthalmology

## 2019-09-07 ENCOUNTER — Encounter (INDEPENDENT_AMBULATORY_CARE_PROVIDER_SITE_OTHER): Payer: Self-pay | Admitting: Ophthalmology

## 2019-09-07 ENCOUNTER — Other Ambulatory Visit: Payer: Self-pay

## 2019-09-07 DIAGNOSIS — H353124 Nonexudative age-related macular degeneration, left eye, advanced atrophic with subfoveal involvement: Secondary | ICD-10-CM

## 2019-09-07 DIAGNOSIS — H353211 Exudative age-related macular degeneration, right eye, with active choroidal neovascularization: Secondary | ICD-10-CM

## 2019-09-07 DIAGNOSIS — E119 Type 2 diabetes mellitus without complications: Secondary | ICD-10-CM

## 2019-09-07 DIAGNOSIS — H353212 Exudative age-related macular degeneration, right eye, with inactive choroidal neovascularization: Secondary | ICD-10-CM

## 2019-09-07 DIAGNOSIS — H4421 Degenerative myopia, right eye: Secondary | ICD-10-CM

## 2019-09-07 MED ORDER — BEVACIZUMAB CHEMO INJECTION 1.25MG/0.05ML SYRINGE FOR KALEIDOSCOPE
1.2500 mg | INTRAVITREAL | Status: AC | PRN
  Administered 2019-09-07: 12:00:00 1.25 mg via INTRAVITREAL

## 2019-09-07 NOTE — Assessment & Plan Note (Signed)
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  Forms of therapy reviewed include the use of Anti-VEGF medications injected painlessly into the eye, as well as other possible treatment modalities, including thermal laser therapy. Fellow eye involvement and risks were discussed with the patient. 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 The treatment regimen is on a treat as needed basis with the intent to treat if necessary and extend interval of exams when possible. On average 1 out of 6 patients do not need lifetime therapy. However, the risk of recurrent disease is high for a lifetime.  Initially monthly, then periodic, examinations and evaluations will determine whether the next treatment is required on the day of the examination.  Improved overall and stable at 7-week interval.  On Avastin, repeat today.  Will reexamine in 8 weeks

## 2019-09-07 NOTE — Progress Notes (Signed)
09/07/2019     CHIEF COMPLAINT Patient presents for Retina Follow Up   HISTORY OF PRESENT ILLNESS: Adrian Bass is a 79 y.o. male who presents to the clinic today for:   HPI    Retina Follow Up    Patient presents with  Wet AMD.  In right eye.  This started 7 weeks ago.  Severity is mild.  Duration of 7 weeks.  Since onset it is stable.          Comments    7 Week AMD F/U OD, poss Avastin OD  Pt sts [facility] OD has improved since last visit x 7 weeks ago. No ocular pain, flashes, or floaters reported OU.       Last edited by Rockie Neighbours, [place] on 09/07/2019 10:42 AM. (History)      Referring physician: No referring provider defined for this encounter.  HISTORICAL INFORMATION:   Selected notes from the MEDICAL RECORD NUMBER    Lab Results  Component Value Date   HGBA1C 7.1 (H) 03/06/2015     CURRENT MEDICATIONS: No current outpatient medications on file. (Ophthalmic Drugs)   No current facility-administered medications for this visit. (Ophthalmic Drugs)   Current Outpatient Medications (Other)  Medication Sig  . Choline Fenofibrate 135 MG capsule Take 1 capsule (135 mg total) by mouth daily.  Marland Kitchen glipiZIDE (GLUCOTROL) 10 MG tablet TAKE ONE TABLET BY MOUTH TWICE DAILY BEFORE A MEAL  . metFORMIN (GLUCOPHAGE) 850 MG tablet Take 1 tablet (850 mg total) by mouth 2 (two) times daily with a meal.  . metoprolol succinate (TOPROL-XL) 25 MG 24 hr tablet TAKE ONE TABLET BY MOUTH ONE TIME DAILY  . rosuvastatin (CRESTOR) 40 MG tablet Take 1 tablet (40 mg total) by mouth daily.  . sertraline (ZOLOFT) 50 MG tablet Take 1 tablet (50 mg total) by mouth daily.  . vardenafil (LEVITRA) 20 MG tablet Take 1 tablet (20 mg total) by mouth as needed for erectile dysfunction.   No current facility-administered medications for this visit. (Other)      REVIEW OF SYSTEMS:    ALLERGIES No Known Allergies  PAST MEDICAL HISTORY Past Medical History:  Diagnosis Date  .  Adenocarcinoma ([place])   . Anemia 02/20/2014  . BPH with obstruction/lower urinary tract symptoms   . Diabetes mellitus    Type II  . Diverticulosis    hx  . Hyperlipidemia   . Hypertension   . Prostate cancer ([place]) 04/25/12   Gleason 4+3=7,&4+5=9,PSA=70.71,Volume=104.6cc   Past Surgical History:  Procedure Laterality Date  . PROSTATE BIOPSY  04/25/12   Adenocarcinoma  . TONSILLECTOMY      FAMILY HISTORY Family History  Problem Relation Age of Onset  . Heart disease Mother   . Heart disease Father   . Cancer Neg Hx   . Depression Neg Hx   . Stroke Neg Hx     SOCIAL HISTORY Social History   Tobacco Use  . Smoking status: Never Smoker  . Smokeless tobacco: Never Used  Substance Use Topics  . Alcohol use: Yes    Alcohol/week: 1.0 standard drinks    Types: 1 Glasses of wine per week    Comment: social 2x week  . Drug use: No         OPHTHALMIC EXAM:  Base Eye Exam    Visual Acuity (Snellen - Linear)      Right Left   Dist [place] 20/60 20/400   Dist ph [place] NI NI  Tonometry (Tonopen, 10:46 AM)      Right Left   Pressure 20 17       Pupils      Pupils Dark Light Shape React APD   Right PERRL 4 3 Round Brisk None   Left PERRL 4 3 Round Brisk None       Visual Fields (Counting fingers)      Left Right    Full Full       Extraocular Movement      Right Left    Full Full       Neuro/Psych    Oriented x3: Yes   Mood/Affect: Normal       Dilation    Right eye: 1.0% Mydriacyl, 2.5% Phenylephrine @ 10:46 AM        Slit Lamp and Fundus Exam    External Exam      Right Left   External Normal        Slit Lamp Exam      Right Left   Lids/Lashes Normal Normal   Conjunctiva/Sclera White and quiet White and quiet   Cornea Clear Clear   Anterior Chamber Deep and quiet Deep and quiet   Iris Round and reactive Round and reactive   Lens Posterior chamber intraocular lens Posterior chamber intraocular lens   Anterior Vitreous Normal Normal        Fundus Exam      Right Left   Posterior Vitreous Posterior vitreous detachment    Disc Peripapillary atrophy    C/D Ratio 0.5    Macula Geographic atrophy, Hemorrhage, Intraretinal hemorrhage    Vessels Normal    Periphery Cobblestone degeneration           IMAGING AND PROCEDURES  Imaging and Procedures for @TODAY @  OCT, Retina - OU - Both Eyes       Right Eye Quality was good. Scan locations included subfoveal. Central Foveal Thickness: 289. Progression has improved. Findings include abnormal foveal contour, myopic contour, subretinal scarring, central retinal atrophy, outer retinal atrophy.   Left Eye Quality was good. Scan locations included subfoveal. Progression has been stable. Findings include myopic contour, abnormal foveal contour, central retinal atrophy, outer retinal atrophy, inner retinal atrophy, subretinal scarring.   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       Intravitreal Injection, Pharmacologic Agent - OD - Right Eye       Time Out 09/07/2019. 12:16 PM. Confirmed correct patient, procedure, site, and patient consented.   Anesthesia Topical anesthesia was used. Anesthetic medications included Akten 3.5%.   Procedure Preparation included Tobramycin 0.3%, Ofloxacin . A 30 gauge needle was used.   Injection:  1.25 mg Bevacizumab (AVASTIN) SOLN   NDC: [id number]   Route: Intravitreal, Site: Right Eye, Waste: 0 mg  Post-op Post injection exam found visual acuity of at least counting fingers. The patient tolerated the procedure well. There were no complications. The patient received written and verbal post procedure care education. Post injection medications were not given.                 ASSESSMENT/PLAN:  Exudative age-related macular degeneration of right eye with active choroidal neovascularization (HCC) The nature of wet macular degeneration was discussed with the patient.  Forms of therapy reviewed  include the use of Anti-VEGF medications injected painlessly into the eye, as well as other possible treatment modalities, including thermal laser therapy. Fellow eye involvement and risks were discussed with the patient. Upon the finding of  wet age related macular degeneration, treatment will be offered. The treatment regimen is on a treat as needed basis with the intent to treat if necessary and extend interval of exams when possible. On average 1 out of 6 patients do not need lifetime therapy. However, the risk of recurrent disease is high for a lifetime.  Initially monthly, then periodic, examinations and evaluations will determine whether the next treatment is required on the day of the examination.  Improved overall and stable at 7-week interval.  On Avastin, repeat today.  Will reexamine in 8 weeks      ICD-10-CM   1. Exudative age-related macular degeneration of right eye with active choroidal neovascularization (HCC)  H35.3211 OCT, Retina - OU - Both Eyes    Intravitreal Injection, Pharmacologic Agent - OD - Right Eye    Bevacizumab (AVASTIN) SOLN 1.25 mg  2. Exudative age-related macular degeneration of right eye with inactive choroidal neovascularization ([place])  H35.3212   3. Advanced nonexudative age-related macular degeneration of left eye with subfoveal involvement  H35.3124   4. 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 Diabetes mellitus without complication ([place])  [id number]     1.  Intravitreal Avastin OD today at 7-week interval, will reexamine in 8 weeks 2.  3.  Ophthalmic Meds Ordered this visit:  Meds ordered this encounter  Medications  . Bevacizumab (AVASTIN) SOLN 1.25 mg       No follow-ups on file.  There are no Patient Instructions on file for this visit.   Explained the diagnoses, plan, and follow up with the patient and they expressed understanding.  Patient expressed understanding of the importance of proper follow up care.   Clent Demark Danyelle [place]over  M.D. Diseases & Surgery of the Retina and Vitreous Retina & Diabetic [place] @TODAY @     Abbreviations: M myopia (nearsighted); A astigmatism; H hyperopia (farsighted); P presbyopia; Mrx spectacle prescription;  CTL contact lenses; OD right eye; OS left eye; OU both eyes  XT exotropia; ET esotropia; PEK punctate epithelial keratitis; PEE punctate epithelial erosions; DES dry eye syndrome; MGD meibomian gland dysfunction; ATs artificial tears; PFAT's preservative free artificial tears; [place] nuclear sclerotic cataract; PSC posterior subcapsular cataract; ERM epi-retinal membrane; PVD posterior vitreous detachment; RD retinal detachment; DM diabetes mellitus; DR diabetic retinopathy; NPDR non-proliferative diabetic retinopathy; PDR proliferative diabetic retinopathy; CSME clinically significant macular edema; DME diabetic macular edema; dbh dot blot hemorrhages; CWS cotton wool spot; POAG primary open angle glaucoma; C/D cup-to-disc ratio; HVF humphrey visual field; GVF goldmann visual field; OCT optical coherence tomography; IOP intraocular pressure; BRVO Branch retinal vein occlusion; CRVO central retinal vein occlusion; CRAO central retinal artery occlusion; BRAO branch retinal artery occlusion; RT retinal tear; SB scleral buckle; PPV pars plana vitrectomy; VH Vitreous hemorrhage; PRP panretinal laser photocoagulation; IVK intravitreal kenalog; VMT vitreomacular traction; MH Macular hole;  NVD neovascularization of the disc; NVE neovascularization elsewhere; AREDS age related eye disease study; ARMD age related macular degeneration; POAG primary open angle glaucoma; EBMD epithelial/anterior basement membrane dystrophy; ACIOL anterior chamber intraocular lens; IOL intraocular lens; PCIOL posterior chamber intraocular lens; Phaco/IOL phacoemulsification with intraocular lens placement; [place] photorefractive keratectomy; LASIK laser assisted in situ keratomileusis; HTN hypertension; DM diabetes mellitus; COPD  chronic obstructive pulmonary disease

## 2019-11-02 ENCOUNTER — Encounter (INDEPENDENT_AMBULATORY_CARE_PROVIDER_SITE_OTHER): Payer: Medicare Other | Admitting: Ophthalmology

## 2019-11-09 ENCOUNTER — Encounter (INDEPENDENT_AMBULATORY_CARE_PROVIDER_SITE_OTHER): Payer: Self-pay | Admitting: Ophthalmology

## 2019-11-09 ENCOUNTER — Ambulatory Visit (INDEPENDENT_AMBULATORY_CARE_PROVIDER_SITE_OTHER): Payer: Medicare Other | Admitting: Ophthalmology

## 2019-11-09 ENCOUNTER — Other Ambulatory Visit: Payer: Self-pay

## 2019-11-09 DIAGNOSIS — H353211 Exudative age-related macular degeneration, right eye, with active choroidal neovascularization: Secondary | ICD-10-CM

## 2019-11-09 MED ORDER — BEVACIZUMAB CHEMO INJECTION 1.25MG/0.05ML SYRINGE FOR KALEIDOSCOPE
1.2500 mg | INTRAVITREAL | Status: AC | PRN
Start: 2019-11-09 — End: 2019-11-09
  Administered 2019-11-09: 1.25 mg via INTRAVITREAL

## 2019-11-09 NOTE — Assessment & Plan Note (Signed)
Right eye is improved on intravitreal Avastin now stabilized in 8 weeks.  History of recurrences just the patient has extensive chorioretinal atrophy surrounding small island of central luteal pigment with good vision OD.

## 2019-11-09 NOTE — Progress Notes (Signed)
11/09/2019     CHIEF COMPLAINT Patient presents for Retina Follow Up   HISTORY OF PRESENT ILLNESS: Adrian Bass is a 79 y.o. male who presents to the clinic today for:   HPI    Retina Follow Up    Patient presents with  Wet AMD.  In right eye.  Severity is moderate.  Duration of 8 weeks.  Since onset it is stable.  I, the attending physician,  performed the HPI with the patient and updated documentation appropriately.          Comments    8 Week AMD f\u OD. Possible Avastin OD. OCT  Pt states no changes or issues.       Last edited by Tilda Franco on 11/09/2019 10:39 AM. (History)      Referring physician: Hurman Horn, MD [facility],  [place] [address]  HISTORICAL INFORMATION:   Selected notes from the MEDICAL RECORD NUMBER    Lab Results  Component Value Date   HGBA1C 7.1 (H) 03/06/2015     CURRENT MEDICATIONS: No current outpatient medications on file. (Ophthalmic Drugs)   No current facility-administered medications for this visit. (Ophthalmic Drugs)   Current Outpatient Medications (Other)  Medication Sig  . Choline Fenofibrate 135 MG capsule Take 1 capsule (135 mg total) by mouth daily.  Marland Kitchen glipiZIDE (GLUCOTROL) 10 MG tablet TAKE ONE TABLET BY MOUTH TWICE DAILY BEFORE A MEAL  . metFORMIN (GLUCOPHAGE) 850 MG tablet Take 1 tablet (850 mg total) by mouth 2 (two) times daily with a meal.  . metoprolol succinate (TOPROL-XL) 25 MG 24 hr tablet TAKE ONE TABLET BY MOUTH ONE TIME DAILY  . rosuvastatin (CRESTOR) 40 MG tablet Take 1 tablet (40 mg total) by mouth daily.  . sertraline (ZOLOFT) 50 MG tablet Take 1 tablet (50 mg total) by mouth daily.  . vardenafil (LEVITRA) 20 MG tablet Take 1 tablet (20 mg total) by mouth as needed for erectile dysfunction.   No current facility-administered medications for this visit. (Other)      REVIEW OF SYSTEMS:    ALLERGIES No Known Allergies  PAST MEDICAL HISTORY Past Medical History:    Diagnosis Date  . Adenocarcinoma ([place])   . Anemia 02/20/2014  . BPH with obstruction/lower urinary tract symptoms   . Diabetes mellitus    Type II  . Diverticulosis    hx  . Hyperlipidemia   . Hypertension   . Prostate cancer ([place]) 04/25/12   Gleason 4+3=7,&4+5=9,PSA=70.71,Volume=104.6cc   Past Surgical History:  Procedure Laterality Date  . PROSTATE BIOPSY  04/25/12   Adenocarcinoma  . TONSILLECTOMY      FAMILY HISTORY Family History  Problem Relation Age of Onset  . Heart disease Mother   . Heart disease Father   . Cancer Neg Hx   . Depression Neg Hx   . Stroke Neg Hx     SOCIAL HISTORY Social History   Tobacco Use  . Smoking status: Never Smoker  . Smokeless tobacco: Never Used  Substance Use Topics  . Alcohol use: Yes    Alcohol/week: 1.0 standard drink    Types: 1 Glasses of wine per week    Comment: social 2x week  . Drug use: No         OPHTHALMIC EXAM:  Base Eye Exam    Visual Acuity (Snellen - Linear)      Right Left   Dist [place] 20/60 E Card @ 4'   Dist ph [place] NI  NI       Tonometry (Tonopen, 10:43 AM)      Right Left   Pressure 18 16       Pupils      Pupils Dark Light Shape React APD   Right PERRL 4 3 Round Brisk None   Left PERRL 4 3 Round Brisk None       Visual Fields (Counting fingers)      Left Right    Full Full       Neuro/Psych    Oriented x3: Yes   Mood/Affect: Normal       Dilation    Both eyes: 1.0% Mydriacyl, 2.5% Phenylephrine @ 10:43 AM        Slit Lamp and Fundus Exam    External Exam      Right Left   External Normal        Slit Lamp Exam      Right Left   Lids/Lashes Normal Normal   Conjunctiva/Sclera White and quiet White and quiet   Cornea Clear Clear   Anterior Chamber Deep and quiet Deep and quiet   Iris Round and reactive Round and reactive   Lens Posterior chamber intraocular lens Posterior chamber intraocular lens   Anterior Vitreous Normal Normal       Fundus Exam      Right Left    Posterior Vitreous Posterior vitreous detachment    Disc Peripapillary atrophy    C/D Ratio 0.6    Macula Geographic atrophy, Hemorrhage, Intraretinal hemorrhage    Vessels Normal    Periphery Cobblestone degeneration           IMAGING AND PROCEDURES  Imaging and Procedures for 11/09/19  OCT, Retina - OU - Both Eyes       Right Eye Quality was poor. Scan locations included subfoveal. Progression has improved. Findings include no IRF, abnormal foveal contour, outer retinal atrophy, inner retinal atrophy, myopic contour.   Left Eye Quality was poor. Scan locations included subfoveal. Progression has been stable. Findings include inner retinal atrophy, myopic contour.   Notes OD no active wet AMD, improved and stabilized at 8 weeks.  Repeat Avastin intravitreal OD today and examination in 10 weeks       Intravitreal Injection, Pharmacologic Agent - OD - Right Eye       Time Out 11/09/2019. 11:40 AM. Confirmed correct patient, procedure, site, and patient consented.   Anesthesia Topical anesthesia was used. Anesthetic medications included Akten 3.5%.   Procedure Preparation included Ofloxacin , 10% betadine to eyelids. A 30 gauge needle was used.   Injection:  1.25 mg Bevacizumab (AVASTIN) SOLN   NDC: [address]-4, Lot: [id number]   Route: Intravitreal, Site: Right Eye, Waste: 0 mg  Post-op Post injection exam found visual acuity of at least counting fingers. The patient tolerated the procedure well. There were no complications. The patient received written and verbal post procedure care education. Post injection medications were not given.                 ASSESSMENT/PLAN:  Exudative age-related macular degeneration of right eye with active choroidal neovascularization (HCC) Right eye is improved on intravitreal Avastin now stabilized in 8 weeks.  History of recurrences just the patient has extensive chorioretinal atrophy surrounding small island of central luteal  pigment with good vision OD.      ICD-10-CM   1. Exudative age-related macular degeneration of right eye with active choroidal neovascularization ([place])  H35.3211 OCT, Retina - OU - Both  Eyes    Intravitreal Injection, Pharmacologic Agent - OD - Right Eye    Bevacizumab (AVASTIN) SOLN 1.25 mg    1.  2.  3.  Ophthalmic Meds Ordered this visit:  Meds ordered this encounter  Medications  . Bevacizumab (AVASTIN) SOLN 1.25 mg       Return in about 10 weeks (around 01/18/2020) for dilate, OD, AVASTIN OCT.  There are no Patient Instructions on file for this visit.   Explained the diagnoses, plan, and follow up with the patient and they expressed understanding.  Patient expressed understanding of the importance of proper follow up care.   Clent Demark Mattisen Pohlmann M.D. Diseases & Surgery of the Retina and Vitreous Retina & Diabetic [place] 11/09/19     Abbreviations: M myopia (nearsighted); A astigmatism; H hyperopia (farsighted); P presbyopia; Mrx spectacle prescription;  CTL contact lenses; OD right eye; OS left eye; OU both eyes  XT exotropia; ET esotropia; PEK punctate epithelial keratitis; PEE punctate epithelial erosions; DES dry eye syndrome; MGD meibomian gland dysfunction; ATs artificial tears; PFAT's preservative free artificial tears; [place] nuclear sclerotic cataract; PSC posterior subcapsular cataract; ERM epi-retinal membrane; PVD posterior vitreous detachment; RD retinal detachment; DM diabetes mellitus; DR diabetic retinopathy; NPDR non-proliferative diabetic retinopathy; PDR proliferative diabetic retinopathy; CSME clinically significant macular edema; DME diabetic macular edema; dbh dot blot hemorrhages; CWS cotton wool spot; POAG primary open angle glaucoma; C/D cup-to-disc ratio; HVF humphrey visual field; GVF goldmann visual field; OCT optical coherence tomography; IOP intraocular pressure; BRVO Branch retinal vein occlusion; CRVO central retinal vein occlusion; CRAO central  retinal artery occlusion; BRAO branch retinal artery occlusion; RT retinal tear; SB scleral buckle; PPV pars plana vitrectomy; VH Vitreous hemorrhage; PRP panretinal laser photocoagulation; IVK intravitreal kenalog; VMT vitreomacular traction; MH Macular hole;  NVD neovascularization of the disc; NVE neovascularization elsewhere; AREDS age related eye disease study; ARMD age related macular degeneration; POAG primary open angle glaucoma; EBMD epithelial/anterior basement membrane dystrophy; ACIOL anterior chamber intraocular lens; IOL intraocular lens; PCIOL posterior chamber intraocular lens; Phaco/IOL phacoemulsification with intraocular lens placement; [place] photorefractive keratectomy; LASIK laser assisted in situ keratomileusis; HTN hypertension; DM diabetes mellitus; COPD chronic obstructive pulmonary disease

## 2020-01-18 ENCOUNTER — Encounter (INDEPENDENT_AMBULATORY_CARE_PROVIDER_SITE_OTHER): Payer: Medicare Other | Admitting: Ophthalmology

## 2020-01-24 ENCOUNTER — Other Ambulatory Visit: Payer: Self-pay

## 2020-01-24 ENCOUNTER — Encounter (INDEPENDENT_AMBULATORY_CARE_PROVIDER_SITE_OTHER): Payer: Self-pay | Admitting: Ophthalmology

## 2020-01-24 ENCOUNTER — Ambulatory Visit (INDEPENDENT_AMBULATORY_CARE_PROVIDER_SITE_OTHER): Payer: Medicare Other | Admitting: Ophthalmology

## 2020-01-24 DIAGNOSIS — H353114 Nonexudative age-related macular degeneration, right eye, advanced atrophic with subfoveal involvement: Secondary | ICD-10-CM | POA: Diagnosis not present

## 2020-01-24 DIAGNOSIS — H353211 Exudative age-related macular degeneration, right eye, with active choroidal neovascularization: Secondary | ICD-10-CM

## 2020-01-24 MED ORDER — BEVACIZUMAB CHEMO INJECTION 1.25MG/0.05ML SYRINGE FOR KALEIDOSCOPE
1.2500 mg | INTRAVITREAL | Status: AC | PRN
  Administered 2020-01-24: 1.25 mg via INTRAVITREAL

## 2020-01-24 NOTE — Assessment & Plan Note (Signed)
Active lesion perifoveal, and extension of fibrosis subfoveal accounts for the acuity.  [id number]

## 2020-01-24 NOTE — Assessment & Plan Note (Signed)
The nature of dry age related macular degeneration was discussed with the patient as well as its possible conversion to wet. The results of the AREDS 2 study was discussed with the patient. A diet rich in dark leafy green vegetables was advised and specific recommendations were made regarding supplements with AREDS 2 formulation . Control of hypertension and serum cholesterol may slow the disease. Smoking cessation is mandatory to slow the disease and diminish the risk of progressing to wet age related macular degeneration. The patient was instructed in the use of an Council and was told to return immediately for any changes in the Grid. Stressed to the patient do not rub eyes OD this condition now splits fixation in conjunction with disciform scarring accounting for acuity

## 2020-01-24 NOTE — Progress Notes (Signed)
01/24/2020     CHIEF COMPLAINT Patient presents for Retina Follow Up   HISTORY OF PRESENT ILLNESS: Adrian Bass is a 79 y.o. male who presents to the clinic today for:   HPI    Retina Follow Up    Patient presents with  Wet AMD.  In right eye.  Severity is moderate.  Duration of 11 weeks.  Since onset it is stable.  I, the attending physician,  performed the HPI with the patient and updated documentation appropriately.          Comments    11 Week Wet AMD f\u OD. Possible Avastin OD. OCT  Pt states no changes in vision. Denies any complaints.       Last edited by Tilda Franco on 01/24/2020 10:57 AM. (History)      Referring physician: No referring provider defined for this encounter.  HISTORICAL INFORMATION:   Selected notes from the MEDICAL RECORD NUMBER    Lab Results  Component Value Date   HGBA1C 7.1 (H) 03/06/2015     CURRENT MEDICATIONS: No current outpatient medications on file. (Ophthalmic Drugs)   No current facility-administered medications for this visit. (Ophthalmic Drugs)   Current Outpatient Medications (Other)  Medication Sig  . Choline Fenofibrate 135 MG capsule Take 1 capsule (135 mg total) by mouth daily.  Marland Kitchen glipiZIDE (GLUCOTROL) 10 MG tablet TAKE ONE TABLET BY MOUTH TWICE DAILY BEFORE A MEAL  . metFORMIN (GLUCOPHAGE) 850 MG tablet Take 1 tablet (850 mg total) by mouth 2 (two) times daily with a meal.  . metoprolol succinate (TOPROL-XL) 25 MG 24 hr tablet TAKE ONE TABLET BY MOUTH ONE TIME DAILY  . rosuvastatin (CRESTOR) 40 MG tablet Take 1 tablet (40 mg total) by mouth daily.  . sertraline (ZOLOFT) 50 MG tablet Take 1 tablet (50 mg total) by mouth daily.  . vardenafil (LEVITRA) 20 MG tablet Take 1 tablet (20 mg total) by mouth as needed for erectile dysfunction.   No current facility-administered medications for this visit. (Other)      REVIEW OF SYSTEMS:    ALLERGIES No Known Allergies  PAST MEDICAL HISTORY Past Medical  History:  Diagnosis Date  . Adenocarcinoma ([place])   . Anemia 02/20/2014  . BPH with obstruction/lower urinary tract symptoms   . Diabetes mellitus    Type II  . Diverticulosis    hx  . Hyperlipidemia   . Hypertension   . Prostate cancer ([place]) 04/25/12   Gleason 4+3=7,&4+5=9,PSA=70.71,Volume=104.6cc   Past Surgical History:  Procedure Laterality Date  . PROSTATE BIOPSY  04/25/12   Adenocarcinoma  . TONSILLECTOMY      FAMILY HISTORY Family History  Problem Relation Age of Onset  . Heart disease Mother   . Heart disease Father   . Cancer Neg Hx   . Depression Neg Hx   . Stroke Neg Hx     SOCIAL HISTORY Social History   Tobacco Use  . Smoking status: Never Smoker  . Smokeless tobacco: Never Used  Substance Use Topics  . Alcohol use: Yes    Alcohol/week: 1.0 standard drink    Types: 1 Glasses of wine per week    Comment: social 2x week  . Drug use: No         OPHTHALMIC EXAM:  Base Eye Exam    Visual Acuity (Snellen - Linear)      Right Left   Dist [place] 20/60 -2 CF @ 5'   Dist ph [place] 20/50 -1  NI       Tonometry (Tonopen, 11:02 AM)      Right Left   Pressure 17 15       Pupils      Dark Light Shape React APD   Right 5 4 Round Brisk None   Left 5 4 Round Brisk None       Visual Fields (Counting fingers)      Left Right    Full Full       Neuro/Psych    Oriented x3: Yes   Mood/Affect: Normal       Dilation    Right eye: 1.0% Mydriacyl, 2.5% Phenylephrine @ 11:02 AM        Slit Lamp and Fundus Exam    External Exam      Right Left   External Normal        Slit Lamp Exam      Right Left   Lids/Lashes Normal Normal   Conjunctiva/Sclera White and quiet White and quiet   Cornea Clear Clear   Anterior Chamber Deep and quiet Deep and quiet   Iris Round and reactive Round and reactive   Lens Posterior chamber intraocular lens Posterior chamber intraocular lens   Anterior Vitreous Normal Normal       Fundus Exam      Right Left   Posterior  Vitreous Posterior vitreous detachment    Disc Peripapillary atrophy    C/D Ratio 0.6    Macula Geographic atrophy, Hemorrhage, Intraretinal hemorrhage    Vessels Normal    Periphery Cobblestone degeneration           IMAGING AND PROCEDURES  Imaging and Procedures for 01/24/20  OCT, Retina - OU - Both Eyes       Right Eye Quality was good. Scan locations included subfoveal. Central Foveal Thickness: 261. Findings include abnormal foveal contour, myopic contour, subretinal hyper-reflective material, disciform scar.   Left Eye Quality was good. Scan locations included subfoveal. Central Foveal Thickness: 277. Findings include abnormal foveal contour, myopic contour, disciform scar, outer retinal atrophy, inner retinal atrophy, central retinal atrophy.   Notes Large regionsOf subretinal fibrosis in the right eye, much less active on intravitreal Avastin, will repeat injection today at 10-week interval and follow-up examination in 3 months       Intravitreal Injection, Pharmacologic Agent - OD - Right Eye       Time Out 01/24/2020. 11:50 AM. Confirmed correct patient, procedure, site, and patient consented.   Anesthesia Topical anesthesia was used. Anesthetic medications included [place] 3.5%.   Procedure Preparation included Ofloxacin , 10% betadine to eyelids, Tobramycin 0.3%, 5% betadine to ocular surface. A supplied needle was used.   Injection:  1.25 mg Bevacizumab (AVASTIN) SOLN   NDC: [address]-2, Lot: [id number]   Route: Intravitreal, Site: Right Eye, Waste: 0 mg  Post-op Post injection exam found visual acuity of at least counting fingers. The patient tolerated the procedure well. There were no complications. The patient received written and verbal post procedure care education. Post injection medications were not given.                 ASSESSMENT/PLAN:  Exudative age-related macular degeneration of right eye with active choroidal neovascularization  (HCC) Active lesion perifoveal, and extension of fibrosis subfoveal accounts for the acuity.  [id number]  Advanced nonexudative age-related macular degeneration of right eye with subfoveal involvement The nature of dry age related macular degeneration was discussed with the patient as well as its possible conversion to  wet. The results of the AREDS 2 study was discussed with the patient. A diet rich in dark leafy green vegetables was advised and specific recommendations were made regarding supplements with AREDS 2 formulation . Control of hypertension and serum cholesterol may slow the disease. Smoking cessation is mandatory to slow the disease and diminish the risk of progressing to wet age related macular degeneration. The patient was instructed in the use of an [place] and was told to return immediately for any changes in the Grid. Stressed to the patient do not rub eyes OD this condition now splits fixation in conjunction with disciform scarring accounting for acuity      ICD-10-CM   1. Exudative age-related macular degeneration of right eye with active choroidal neovascularization (HCC)  H35.3211 OCT, Retina - OU - Both Eyes    Intravitreal Injection, Pharmacologic Agent - OD - Right Eye    Bevacizumab (AVASTIN) SOLN 1.25 mg  2. Advanced nonexudative age-related macular degeneration of right eye with subfoveal involvement  H35.3114     1.  2.  3.  Ophthalmic Meds Ordered this visit:  Meds ordered this encounter  Medications  . Bevacizumab (AVASTIN) SOLN 1.25 mg       Return in about 3 months (around 04/24/2020) for DILATE OU, AVASTIN OCT, OD.  Patient Instructions  Patient reporting new onset visual acuity decline or distortion    Explained the diagnoses, plan, and follow up with the patient and they expressed understanding.  Patient expressed understanding of the importance of proper follow up care.   Clent Demark Nohely Whitehorn M.D. Diseases & Surgery of the Retina and  Vitreous Retina & Diabetic [place] 01/24/20     Abbreviations: M myopia (nearsighted); A astigmatism; H hyperopia (farsighted); P presbyopia; Mrx spectacle prescription;  CTL contact lenses; OD right eye; OS left eye; OU both eyes  XT exotropia; ET esotropia; PEK punctate epithelial keratitis; PEE punctate epithelial erosions; DES dry eye syndrome; MGD meibomian gland dysfunction; ATs artificial tears; PFAT's preservative free artificial tears; [place] nuclear sclerotic cataract; PSC posterior subcapsular cataract; ERM epi-retinal membrane; PVD posterior vitreous detachment; RD retinal detachment; DM diabetes mellitus; DR diabetic retinopathy; NPDR non-proliferative diabetic retinopathy; PDR proliferative diabetic retinopathy; CSME clinically significant macular edema; DME diabetic macular edema; dbh dot blot hemorrhages; CWS cotton wool spot; POAG primary open angle glaucoma; C/D cup-to-disc ratio; HVF humphrey visual field; GVF goldmann visual field; OCT optical coherence tomography; IOP intraocular pressure; BRVO Branch retinal vein occlusion; CRVO central retinal vein occlusion; CRAO central retinal artery occlusion; BRAO branch retinal artery occlusion; RT retinal tear; SB scleral buckle; PPV pars plana vitrectomy; VH Vitreous hemorrhage; PRP panretinal laser photocoagulation; IVK intravitreal kenalog; VMT vitreomacular traction; MH Macular hole;  NVD neovascularization of the disc; NVE neovascularization elsewhere; AREDS age related eye disease study; ARMD age related macular degeneration; POAG primary open angle glaucoma; EBMD epithelial/anterior basement membrane dystrophy; ACIOL anterior chamber intraocular lens; IOL intraocular lens; PCIOL posterior chamber intraocular lens; Phaco/IOL phacoemulsification with intraocular lens placement; [place] photorefractive keratectomy; LASIK laser assisted in situ keratomileusis; HTN hypertension; DM diabetes mellitus; COPD chronic obstructive pulmonary disease

## 2020-01-24 NOTE — Patient Instructions (Signed)
Patient reporting new onset visual acuity decline or distortion

## 2020-03-18 DEATH — deceased

## 2020-04-17 ENCOUNTER — Encounter (INDEPENDENT_AMBULATORY_CARE_PROVIDER_SITE_OTHER): Payer: Medicare Other | Admitting: Ophthalmology

## 2020-04-17 ENCOUNTER — Encounter (INDEPENDENT_AMBULATORY_CARE_PROVIDER_SITE_OTHER): Payer: Self-pay

## 2020-04-24 ENCOUNTER — Encounter (INDEPENDENT_AMBULATORY_CARE_PROVIDER_SITE_OTHER): Payer: Medicare Other | Admitting: Ophthalmology
